# Patient Record
Sex: Female | Born: 1995 | Race: Black or African American | Hispanic: No | Smoking: Former smoker
Health system: Southern US, Community
[De-identification: ages and names within clinical notes are randomized; demographics above are authoritative.]

---
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
## 2017-03-09 ENCOUNTER — Other Ambulatory Visit: Payer: Self-pay | Admitting: Family Medicine

## 2017-03-09 DIAGNOSIS — I809 Phlebitis and thrombophlebitis of unspecified site: Secondary | ICD-10-CM

## 2017-03-10 ENCOUNTER — Ambulatory Visit
Admission: RE | Admit: 2017-03-10 | Discharge: 2017-03-10 | Disposition: A | Discharge status: Home / Self Care | Payer: Self-pay | Source: Ambulatory Visit | Attending: Family Medicine | Admitting: Family Medicine

## 2017-03-10 DIAGNOSIS — M7989 Other specified soft tissue disorders: Secondary | ICD-10-CM | POA: Insufficient documentation

## 2017-03-10 DIAGNOSIS — I809 Phlebitis and thrombophlebitis of unspecified site: Secondary | ICD-10-CM | POA: Insufficient documentation

## 2019-02-01 IMAGING — US US EXTREM LOW VENOUS*R*
1 series · 13 of 24 positions shown · non-contrast
Comparison: None.

CLINICAL DATA: Thrombophlebitis involving the right leg
approximately 4 weeks ago with persistent right lower extremity
pain. Evaluate for DVT.



[Series 1: us extrem low venous*right* · 0.08mm/px · 13 of 63 slices shown]
[im 1/63]
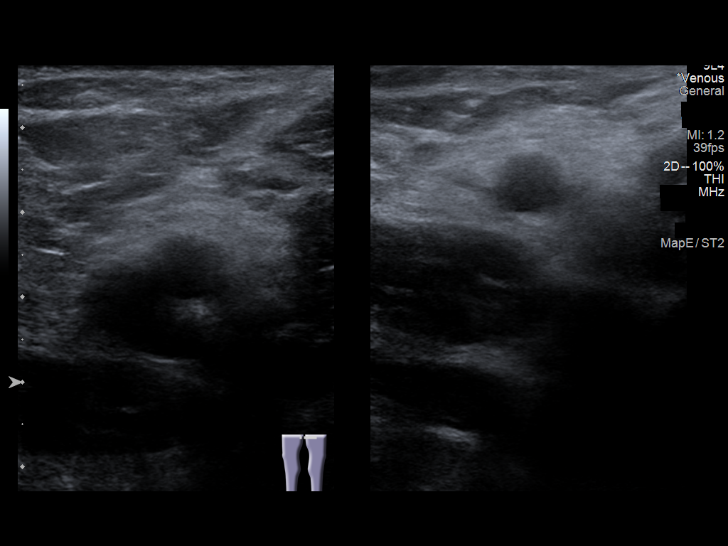
[im 6/63]
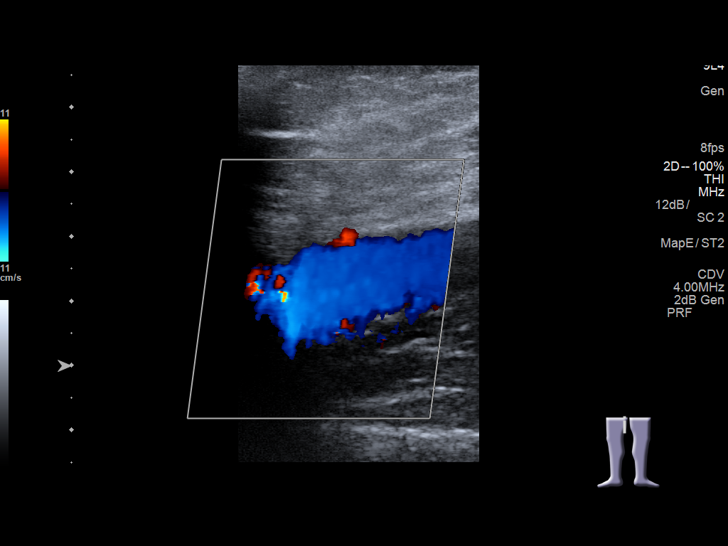
[im 11/63]
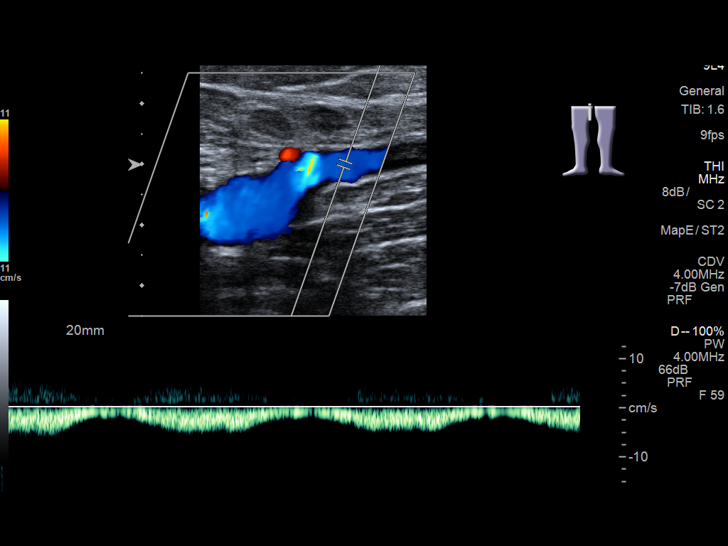
[im 17/63]
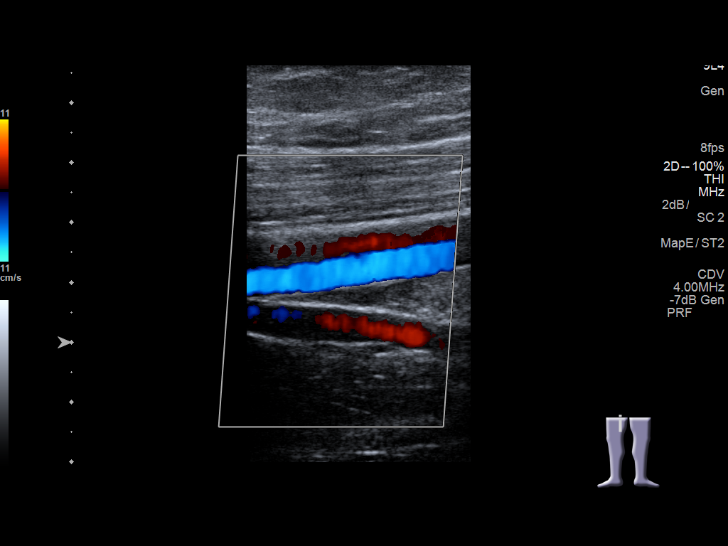
[im 22/63]
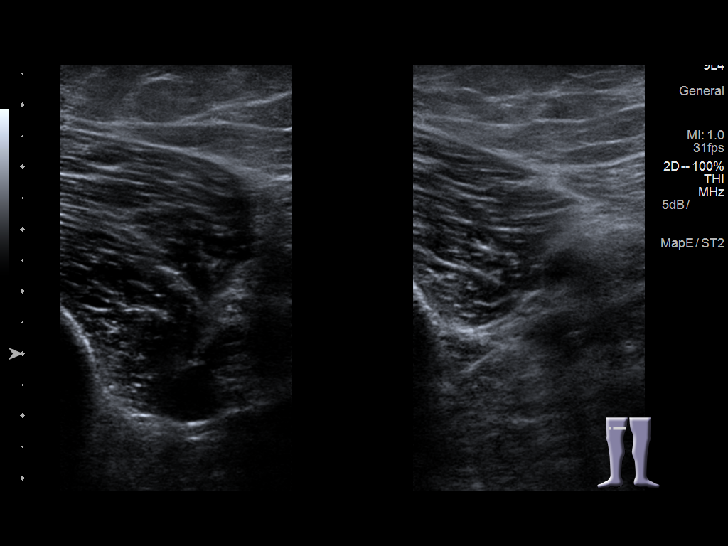
[im 27/63]
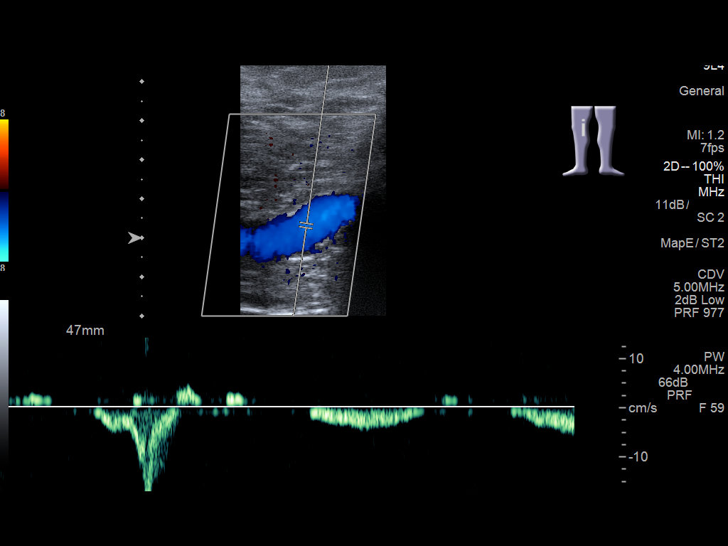
[im 33/63]
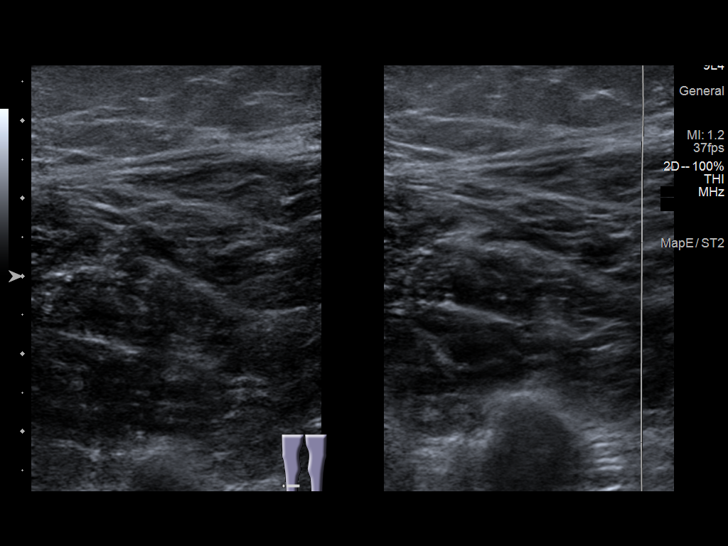
[im 36/63]
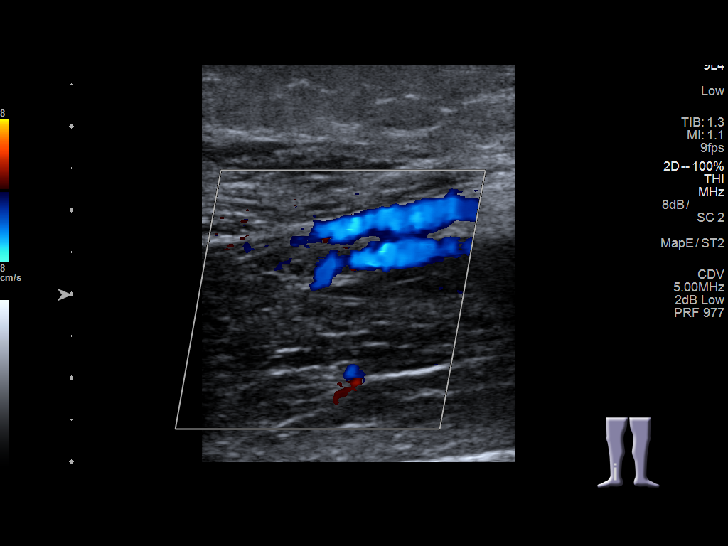
[im 41/63]
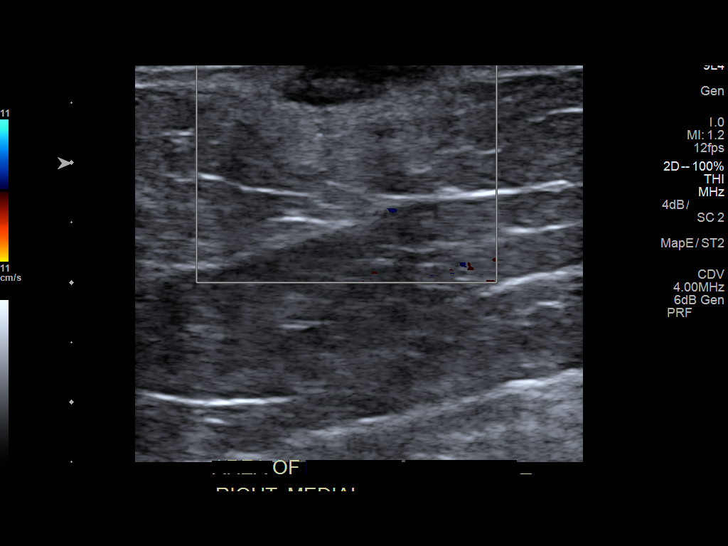
[im 46/63]
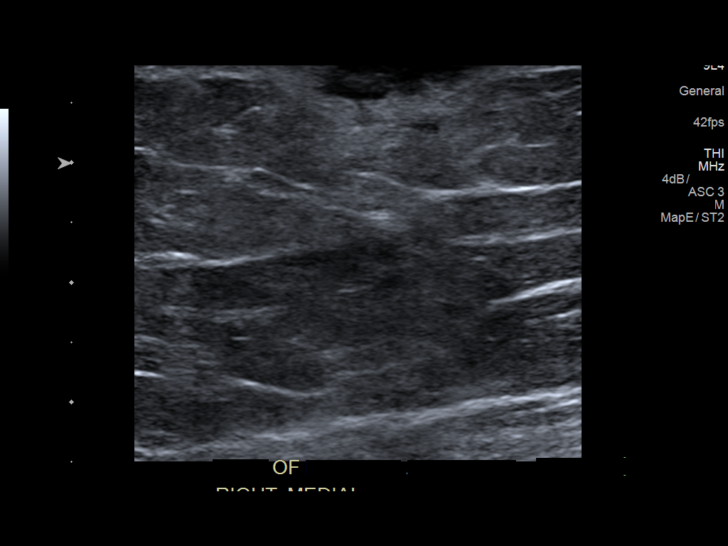
[im 52/63]
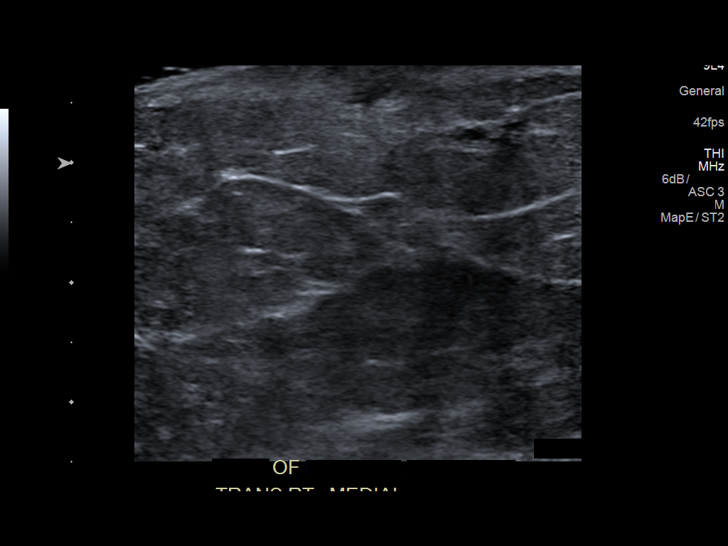
[im 57/63]
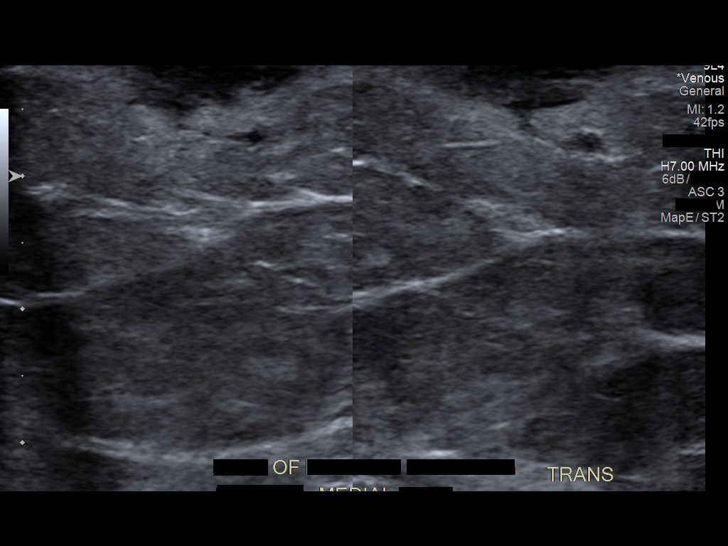
[im 63/63]
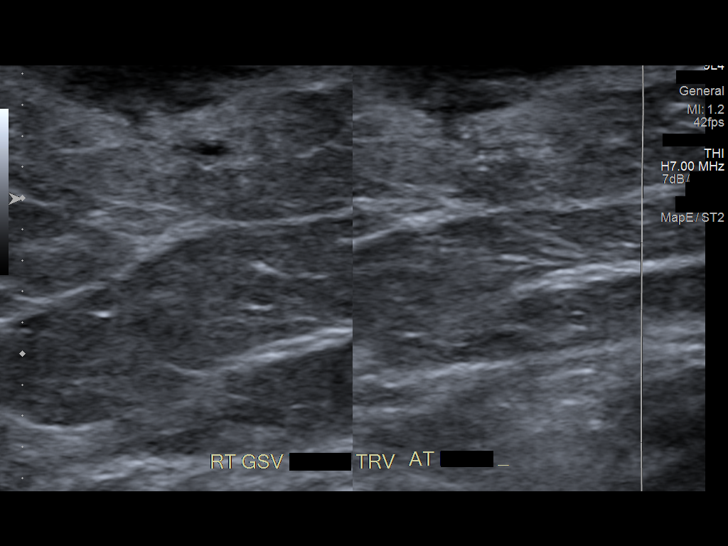

[13 of 24 positions shown; findings below may reference images not displayed]

FINDINGS: Contralateral Common Femoral Vein: Respiratory phasicity is normal
and symmetric with the symptomatic side. No evidence of thrombus.
Normal compressibility.

Common Femoral Vein: No evidence of thrombus. Normal
compressibility, respiratory phasicity and response to augmentation.

Saphenofemoral Junction: No evidence of thrombus. Normal
compressibility and flow on color Doppler imaging.

Profunda Femoral Vein: No evidence of thrombus. Normal
compressibility and flow on color Doppler imaging.

Femoral Vein: No evidence of thrombus. Normal compressibility,
respiratory phasicity and response to augmentation.

Popliteal Vein: No evidence of thrombus. Normal compressibility,
respiratory phasicity and response to augmentation.

Calf Veins: No evidence of thrombus. Normal compressibility and flow
on color Doppler imaging.

Superficial Great Saphenous Vein: No evidence of thrombus. Normal
compressibility.

Venous Reflux:  None.

Other Findings: Patient's area of concern appears to correlate with
an ill-defined approximately 1.4 x 1.2 x 0.5 cm hypoechoic apparent
fluid collection within the subdermal surface of the medial aspect
the right knee (representative images 58 and 59).
IMPRESSION: 1. No evidence of DVT within right lower extremity.
2. Patient's area of concern correlates with a punctate
(approximately 1.4 cm) apparent fluid collection within the
subdermal tissues of the right lower leg - differential
considerations are broad though include hematoma, abscess and
seroma. Clinical correlation is advised.

## 2020-09-04 ENCOUNTER — Other Ambulatory Visit: Payer: Self-pay

## 2020-09-04 ENCOUNTER — Emergency Department
Admission: EM | Admit: 2020-09-04 | Discharge: 2020-09-04 | Disposition: A | Discharge status: Home / Self Care | Payer: Medicaid Other | Attending: Emergency Medicine | Admitting: Emergency Medicine

## 2020-09-04 ENCOUNTER — Emergency Department: Payer: Medicaid Other

## 2020-09-04 DIAGNOSIS — O23591 Infection of other part of genital tract in pregnancy, first trimester: Secondary | ICD-10-CM | POA: Insufficient documentation

## 2020-09-04 DIAGNOSIS — N760 Acute vaginitis: Secondary | ICD-10-CM

## 2020-09-04 DIAGNOSIS — B9689 Other specified bacterial agents as the cause of diseases classified elsewhere: Secondary | ICD-10-CM | POA: Insufficient documentation

## 2020-09-04 DIAGNOSIS — O26891 Other specified pregnancy related conditions, first trimester: Secondary | ICD-10-CM | POA: Diagnosis present

## 2020-09-04 DIAGNOSIS — Z3A01 Less than 8 weeks gestation of pregnancy: Secondary | ICD-10-CM | POA: Insufficient documentation

## 2020-09-04 DIAGNOSIS — O219 Vomiting of pregnancy, unspecified: Secondary | ICD-10-CM

## 2020-09-04 LAB — HCG, QUANTITATIVE, PREGNANCY: hCG, Beta Chain, Quant, S: 58201 m[IU]/mL — ABNORMAL HIGH (ref <5)

## 2020-09-04 LAB — COMPREHENSIVE METABOLIC PANEL
ALT: 12 U/L (ref 0–44)
AST: 18 U/L (ref 15–41)
Albumin: 4.3 g/dL (ref 3.5–5.0)
Alkaline Phosphatase: 37 U/L — ABNORMAL LOW (ref 38–126)
Anion gap: 8 (ref 5–15)
BUN: 9 mg/dL (ref 6–20)
CO2: 23 mmol/L (ref 22–32)
Calcium: 9.1 mg/dL (ref 8.9–10.3)
Chloride: 101 mmol/L (ref 98–111)
Creatinine, Ser: 0.81 mg/dL (ref 0.44–1.00)
GFR, Estimated: >60 mL/min (ref >60)
Glucose, Bld: 88 mg/dL (ref 70–99)
Potassium: 4.2 mmol/L (ref 3.5–5.1)
Sodium: 132 mmol/L — ABNORMAL LOW (ref 135–145)
Total Bilirubin: 1.3 mg/dL — ABNORMAL HIGH (ref 0.3–1.2)
Total Protein: 7.7 g/dL (ref 6.5–8.1)

## 2020-09-04 LAB — URINALYSIS, COMPLETE (UACMP) WITH MICROSCOPIC
Bilirubin Urine: NEGATIVE
Glucose, UA: NEGATIVE mg/dL
Hgb urine dipstick: NEGATIVE
Ketones, ur: 20 mg/dL — AB
Leukocytes,Ua: NEGATIVE
Nitrite: NEGATIVE
Protein, ur: NEGATIVE mg/dL
Specific Gravity, Urine: 1.023 (ref 1.005–1.030)
pH: 5 (ref 5.0–8.0)

## 2020-09-04 LAB — CBC
HCT: 37.8 % (ref 36.0–46.0)
Hemoglobin: 13.3 g/dL (ref 12.0–15.0)
MCH: 31.5 pg (ref 26.0–34.0)
MCHC: 35.2 g/dL (ref 30.0–36.0)
MCV: 89.6 fL (ref 80.0–100.0)
Platelets: 226 10*3/uL (ref 150–400)
RBC: 4.22 MIL/uL (ref 3.87–5.11)
RDW: 12 % (ref 11.5–15.5)
WBC: 7.3 10*3/uL (ref 4.0–10.5)
nRBC: 0 % (ref 0.0–0.2)

## 2020-09-04 LAB — WET PREP, GENITAL
Sperm: NONE SEEN
Trich, Wet Prep: NONE SEEN
Yeast Wet Prep HPF POC: NONE SEEN

## 2020-09-04 LAB — ABO/RH: ABO/RH(D): O POS

## 2020-09-04 LAB — LIPASE, BLOOD: Lipase: 25 U/L (ref 11–51)

## 2020-09-04 LAB — POC URINE PREG, ED: Preg Test, Ur: POSITIVE — AB

## 2020-09-04 LAB — CHLAMYDIA/NGC RT PCR (ARMC ONLY)
Chlamydia Tr: NOT DETECTED
N gonorrhoeae: NOT DETECTED

## 2020-09-04 MED ORDER — METRONIDAZOLE 500 MG PO TABS: 500.0000 mg | ORAL_TABLET | Freq: Two times a day (BID) | ORAL | 0 refills | Status: DC

## 2020-09-04 MED ORDER — DOXYLAMINE-PYRIDOXINE 10-10 MG PO TBEC: 2.0000 | DELAYED_RELEASE_TABLET | Freq: Every day | ORAL | 0 refills | Status: DC

## 2020-09-04 NOTE — ED Triage Notes (Signed)
Pt to ER via POV. States she found out she was pregnant several days ago. Reports constant nausea, lack of appetite, and lower abdominal cramping. Denies urinary symptoms or diarrhea. Reports increased vaginal discharge that is pink tinged. Denies fevers.

## 2020-09-04 NOTE — ED Provider Notes (Signed)
[facility] Emergency Department Provider Note   ____________________________________________   Event Date/Time   First MD Initiated Contact with Patient 09/04/20 1435     (approximate)  I have reviewed the triage vital signs and the nursing notes.   HISTORY  Chief Complaint Abdominal Pain    HPI Tammy Allen is a 25 y.o. female G1, P0 Patient reports her last menstrual cycle was about the end of the first week of April.  Then about a week ago she found out she was pregnant on a home pregnancy test.  And for about a week now she has been experiencing a mild at times feels like a slight discomfort or crampiness in her lower abdomen.  She reports it feels like gas.  Associated with mild nausea and a slight food aversion.  No vomiting.  She still able to eat and drink but not as with her typical appetite  No fevers or chills.  No chest pain.  Does not take any medications or contraceptives.  No fertility treatments utilized  No history of previous surgeries  She does report she is had a slight mostly white occasionally very light pink discharge from the vagina.  No fevers or chills.  No vaginal pain.  No blood vaginal bleeding  No upper abdominal pain pain is all lower and seem sort of crampy with slight nausea.   History reviewed. No pertinent past medical history.  There are no problems to display for this patient.   History reviewed. No pertinent surgical history.  Prior to Admission medications   Medication Sig Start Date End Date Taking? Authorizing Provider  Doxylamine-Pyridoxine 10-10 MG TBEC Take 2 tablets by mouth at bedtime. 09/04/20  Yes Sharyn Creamer, MD  metroNIDAZOLE (FLAGYL) 500 MG tablet Take 1 tablet (500 mg total) by mouth 2 (two) times daily. 09/04/20  Yes Sharyn Creamer, MD  ondansetron (ZOFRAN ODT) 4 MG disintegrating tablet Take 1 tablet (4 mg total) by mouth every 8 (eight) hours as needed for nausea or vomiting. 03/31/16   Rebecka Apley, MD  sulfamethoxazole-trimethoprim (BACTRIM DS,SEPTRA DS) 800-160 MG tablet Take 1 tablet by mouth 2 (two) times daily. 03/31/16   Rebecka Apley, MD  Currently not taking any medication  Allergies Patient has no known allergies.  No family history on file.  Social History Social History   Tobacco Use  . Smoking status: Never Smoker  Substance Use Topics  . Alcohol use: Yes    Review of Systems Constitutional: No fever/chills Eyes: No visual changes.   Cardiovascular: Denies chest pain. Respiratory: Denies shortness of breath. Gastrointestinal: See HPI Genitourinary: Negative for dysuria. Musculoskeletal: Negative for back pain. Skin: Negative for rash. Neurological: Negative for headaches or weakness    ____________________________________________   PHYSICAL EXAM:  VITAL SIGNS: ED Triage Vitals  Enc Vitals Group     BP 09/04/20 1249 130/79     Pulse Rate 09/04/20 1249 62     Resp 09/04/20 1249 18     Temp 09/04/20 1249 (!) 97.5 F (36.4 C)     Temp Source 09/04/20 1249 Oral     SpO2 09/04/20 1249 100 %     Weight 09/04/20 1259 198 lb (89.8 kg)     Height 09/04/20 1259 5\' 5"  (1.651 m)     Head Circumference --      Peak Flow --      Pain Score 09/04/20 1259 4     Pain Loc --  Pain Edu? --      Excl. in GC? --     Constitutional: Alert and oriented. Well appearing and in no acute distress. Eyes: Conjunctivae are normal. Head: Atraumatic. Nose: No congestion/rhinnorhea. Mouth/Throat: Mucous membranes are moist. Neck: No stridor.  Cardiovascular: Normal rate, regular rhythm. Grossly normal heart sounds.  Good peripheral circulation. Respiratory: Normal respiratory effort.  No retractions. Lungs CTAB. Gastrointestinal: Soft and nontender. No distention.  No reproducible tenderness on palpation today.  Not obviously gravid on exam. Pelvic performed with RN Melina Schools Register demonstrates normal external exam, no tenderness on internal exam  with speculum.  Small amount of slightly white somewhat thick discharge is present in the vagina.  No bleeding. Musculoskeletal: No lower extremity tenderness nor edema. Neurologic:  Normal speech and language. No gross focal neurologic deficits are appreciated.  Skin:  Skin is warm, dry and intact. No rash noted. Psychiatric: Mood and affect are normal. Speech and behavior are normal.  ____________________________________________   LABS (all labs ordered are listed, but only abnormal results are displayed)  Labs Reviewed  WET PREP, GENITAL - Abnormal; Notable for the following components:      Result Value   Clue Cells Wet Prep HPF POC PRESENT (*)    WBC, Wet Prep HPF POC FEW (*)    All other components within normal limits  COMPREHENSIVE METABOLIC PANEL - Abnormal; Notable for the following components:   Sodium 132 (*)    Alkaline Phosphatase 37 (*)    Total Bilirubin 1.3 (*)    All other components within normal limits  URINALYSIS, COMPLETE (UACMP) WITH MICROSCOPIC - Abnormal; Notable for the following components:   Color, Urine YELLOW (*)    APPearance HAZY (*)    Ketones, ur 20 (*)    Bacteria, UA RARE (*)    All other components within normal limits  HCG, QUANTITATIVE, PREGNANCY - Abnormal; Notable for the following components:   hCG, Beta Chain, Quant, S 58,201 (*)    All other components within normal limits  POC URINE PREG, ED - Abnormal; Notable for the following components:   Preg Test, Ur Positive (*)    All other components within normal limits  CHLAMYDIA/NGC RT PCR ([facility] ONLY)  LIPASE, BLOOD  CBC  ABO/RH   ____________________________________________  EKG   ____________________________________________  RADIOLOGY  US OB Comp < 14 Wks  Result Date: 09/04/2020 CLINICAL DATA:  New pregnancy crampy lower abdominal discomfort EXAM: OBSTETRIC <14 WK [place] AND TRANSVAGINAL OB US TECHNIQUE: Both transabdominal and transvaginal ultrasound examinations were  performed for complete evaluation of the gestation as well as the maternal uterus, adnexal regions, and pelvic cul-de-sac. Transvaginal technique was performed to assess early pregnancy. COMPARISON:  None. FINDINGS: Intrauterine gestational sac: Single Yolk sac:  Visualized Embryo:  Visualized Cardiac Activity: Visualized Heart Rate: 110 bpm CRL: 4.3 mm   6 w   1 d                  [place] EDC: 04/29/2021 Subchorionic hemorrhage:  None visualized. Maternal uterus/adnexae: Ovaries are within normal limits. Right ovary measures 2.9 x 1.7 x 1.6 cm. Left ovary measures 2 x 1.2 x 2.6 cm. No significant free fluid. IMPRESSION: Single viable intrauterine pregnancy as above. No specific abnormality is seen Electronically Signed   By: Jasmine Pang M.D.   On: 09/04/2020 17:05   US OB Transvaginal  Result Date: 09/04/2020 CLINICAL DATA:  New pregnancy crampy lower abdominal discomfort EXAM: OBSTETRIC <14 WK [place] AND TRANSVAGINAL OB  US TECHNIQUE: Both transabdominal and transvaginal ultrasound examinations were performed for complete evaluation of the gestation as well as the maternal uterus, adnexal regions, and pelvic cul-de-sac. Transvaginal technique was performed to assess early pregnancy. COMPARISON:  None. FINDINGS: Intrauterine gestational sac: Single Yolk sac:  Visualized Embryo:  Visualized Cardiac Activity: Visualized Heart Rate: 110 bpm CRL: 4.3 mm   6 w   1 d                  [place] EDC: 04/29/2021 Subchorionic hemorrhage:  None visualized. Maternal uterus/adnexae: Ovaries are within normal limits. Right ovary measures 2.9 x 1.7 x 1.6 cm. Left ovary measures 2 x 1.2 x 2.6 cm. No significant free fluid. IMPRESSION: Single viable intrauterine pregnancy as above. No specific abnormality is seen Electronically Signed   By: Jasmine Pang M.D.   On: 09/04/2020 17:05    Ultrasound imaging reviewed positive intrauterine pregnancy.  Approximately 6 weeks 1  day ____________________________________________   PROCEDURES  Procedure(s) performed: None  Procedures  Critical Care performed: No  ____________________________________________   INITIAL IMPRESSION / ASSESSMENT AND PLAN / ED COURSE  Pertinent labs & imaging results that were available during my care of the patient were reviewed by me and considered in my medical decision making (see chart for details).   First pregnancy.  Very reassuring abdominal exam afebrile reassuring labs with normal white count.  Clinical exam very reassuring, she does report a slight mostly whitish discharge, check wet prep and GC.  Pelvic exam reassuring   Does not appear acutely toxic.  In no acute distress.  No cardiac pulmonary vascular or neurologic symptoms.  Will obtain ultrasound, hCG, blood type positive  ----------------------------------------- 6:38 PM on 09/04/2020 -----------------------------------------  Lab work reassuring, wet prep indicative of possible bacterial vaginosis.  Discussed with patient, will treat with oral Flagyl.  Also will prescribe doxylamine for pregnancy associated nausea.  Discussed careful return precautions and follow-up care.  Patient does plan to return to her home state of [place] on June 2 or 3.  Advised on careful return precautions.  Return precautions and treatment recommendations and follow-up discussed with the patient who is agreeable with the plan.       ____________________________________________   FINAL CLINICAL IMPRESSION(S) / ED DIAGNOSES  Final diagnoses:  BV (bacterial vaginosis)  Nausea/vomiting in pregnancy        Note:  This document was prepared using Dragon voice recognition software and may include unintentional dictation errors       Sharyn Creamer, MD 09/04/20 1839

## 2020-09-04 NOTE — ED Notes (Signed)
Patient returned from US.

## 2020-09-04 NOTE — ED Notes (Signed)
ABO/Rh added on by lab.

## 2020-09-05 ENCOUNTER — Encounter: Payer: Self-pay | Admitting: Obstetrics

## 2020-10-10 ENCOUNTER — Ambulatory Visit: Payer: Self-pay

## 2020-10-14 ENCOUNTER — Telehealth: Payer: Self-pay

## 2020-10-14 NOTE — Telephone Encounter (Signed)
Call transferred to [facility] from [place]on Passey in clerical. Client verbally upset regarding what she stated was the "run around regarding getting appt". Client counseled on presumptive Medicaid, need to apply for pregnancy Medicaid and contents of initial [facility] IP appt. (Client to [facility] ED 09/04/2020 and UPT positive with OB US completed. Per client,  LMP = 07/17/2020). Encouraged coming to [facility] to complete pre-admission paperwork prior to appt RN scheduled for 10/16/2020 with arrival time of 1245. Per client, has transportation issues and unlikely can complete pre-admission prior to appt. Client states will be leaving ~ first of August to return home, but still desired appt. Counseled to find [facility] in hometown prior to 10/16/20 appt so ROI can be signed for record transfer. Jossie Ng, RN

## 2020-10-16 ENCOUNTER — Encounter: Payer: Self-pay | Admitting: Advanced Practice Midwife

## 2020-10-16 ENCOUNTER — Other Ambulatory Visit: Payer: Self-pay

## 2020-10-16 ENCOUNTER — Ambulatory Visit: Payer: Medicaid Other | Admitting: Advanced Practice Midwife

## 2020-10-16 VITALS — BP 107/69 | HR 67 | Temp 98.2°F | Wt 196.2 lb

## 2020-10-16 DIAGNOSIS — T7421XA Adult sexual abuse, confirmed, initial encounter: Secondary | ICD-10-CM | POA: Insufficient documentation

## 2020-10-16 DIAGNOSIS — Z62810 Personal history of physical and sexual abuse in childhood: Secondary | ICD-10-CM | POA: Insufficient documentation

## 2020-10-16 DIAGNOSIS — Z3401 Encounter for supervision of normal first pregnancy, first trimester: Secondary | ICD-10-CM

## 2020-10-16 DIAGNOSIS — Z3480 Encounter for supervision of other normal pregnancy, unspecified trimester: Secondary | ICD-10-CM | POA: Insufficient documentation

## 2020-10-16 DIAGNOSIS — O99211 Obesity complicating pregnancy, first trimester: Secondary | ICD-10-CM

## 2020-10-16 DIAGNOSIS — T7421XS Adult sexual abuse, confirmed, sequela: Secondary | ICD-10-CM

## 2020-10-16 DIAGNOSIS — F32A Depression, unspecified: Secondary | ICD-10-CM

## 2020-10-16 DIAGNOSIS — O99210 Obesity complicating pregnancy, unspecified trimester: Secondary | ICD-10-CM | POA: Insufficient documentation

## 2020-10-16 DIAGNOSIS — Z720 Tobacco use: Secondary | ICD-10-CM

## 2020-10-16 DIAGNOSIS — O99341 Other mental disorders complicating pregnancy, first trimester: Secondary | ICD-10-CM

## 2020-10-16 DIAGNOSIS — O99340 Other mental disorders complicating pregnancy, unspecified trimester: Secondary | ICD-10-CM | POA: Insufficient documentation

## 2020-10-16 DIAGNOSIS — B3731 Acute candidiasis of vulva and vagina: Secondary | ICD-10-CM

## 2020-10-16 DIAGNOSIS — B373 Candidiasis of vulva and vagina: Secondary | ICD-10-CM

## 2020-10-16 HISTORY — DX: Other mental disorders complicating pregnancy, unspecified trimester: O99.340

## 2020-10-16 HISTORY — DX: Depression, unspecified: F32.A

## 2020-10-16 LAB — WET PREP FOR TRICH, YEAST, CLUE
Clue Cell Exam: POSITIVE — AB
Trichomonas Exam: NEGATIVE

## 2020-10-16 LAB — URINALYSIS
Bilirubin, UA: NEGATIVE
Glucose, UA: NEGATIVE
Nitrite, UA: NEGATIVE
Protein,UA: NEGATIVE
RBC, UA: NEGATIVE
Specific Gravity, UA: 1.025 (ref 1.005–1.030)
Urobilinogen, Ur: 1 mg/dL (ref 0.2–1.0)
pH, UA: 7 (ref 5.0–7.5)

## 2020-10-16 LAB — HEMOGLOBIN, FINGERSTICK: Hemoglobin: 12.2 g/dL (ref 11.1–15.9)

## 2020-10-16 MED ORDER — PRENATAL VITAMIN 27-0.8 MG PO TABS: 1.0000 | ORAL_TABLET | Freq: Every day | ORAL | 0 refills | Status: AC

## 2020-10-16 MED ORDER — CLOTRIMAZOLE 1 % VA CREA: 1.0000 | TOPICAL_CREAM | Freq: Every day | VAGINAL | 0 refills | Status: AC
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
## 2020-10-16 NOTE — Progress Notes (Signed)
Patient here for new OB visit at about 13 weeks. Patient states she has been in [place] in past 12 months during her time in the [employer]. ED visit 09/04/2020 with U/S. States she had a pap test in 09/2019 in [place]. Needs high BMI labs today.Burt Knack, RN

## 2020-10-16 NOTE — Progress Notes (Signed)
In house labs reviewed. No new orders. Patient treated for yeast per SO. Hazeline Junker card and dental list given. Patient plans to go to [facility] today. [facility] referral for genetic testing faxed with confirmation and patient counseled to expect call with appt.Burt Knack, RN

## 2020-10-17 ENCOUNTER — Telehealth: Payer: Self-pay

## 2020-10-17 LAB — PAP IG (IMAGE GUIDED): PAP Smear Comment: 0

## 2020-10-17 NOTE — Telephone Encounter (Signed)
Provider M. Garner, FNP asked to forward this information to E. Sciora, CNM.   Tammy Annunziato, RN 

## 2020-10-17 NOTE — Telephone Encounter (Signed)
Received a call from Britta Mccreedy, from [facility] MFM [place] scheduling department. She states they are unable to schedule a <14 week [place] since they are all booked until Mid to late August. She suggested calling [facility] in [place] or order one that's is over 14 weeks.   Will call [name] shortly for availability.   [name], RN

## 2020-10-17 NOTE — Telephone Encounter (Signed)
Contacted [name] regarding availability on their end. She states that Radiology has an opening this coming Wednesday but in order for them to schedule order mus not say words "MFM". Their number is [phone]. Also, genetic counseling is not available since it is under radiology.   Patient states she is willing to drive to [place].   So unsure to proceed with [place] in [facility] Radiology with no genetic counseling or have patient get [place] over 14 weeks in Mid to late August when they are available.   Will forward message to provider, Elveria Rising, FNP.   [name], RN

## 2020-10-19 LAB — CHLAMYDIA/GC NAA, CONFIRMATION
Chlamydia trachomatis, NAA: NEGATIVE
Neisseria gonorrhoeae, NAA: NEGATIVE

## 2020-10-20 ENCOUNTER — Other Ambulatory Visit: Payer: Self-pay | Admitting: Family Medicine

## 2020-10-20 ENCOUNTER — Other Ambulatory Visit: Payer: Self-pay | Admitting: Internal Medicine

## 2020-10-20 ENCOUNTER — Telehealth: Payer: Self-pay | Admitting: Family Medicine

## 2020-10-20 DIAGNOSIS — Z3401 Encounter for supervision of normal first pregnancy, first trimester: Secondary | ICD-10-CM

## 2020-10-20 LAB — CBC/D/PLT+RPR+RH+ABO+AB SCR
Antibody Screen: NEGATIVE
Basophils Absolute: 0 10*3/uL (ref 0.0–0.2)
Basos: 1 %
EOS (ABSOLUTE): 0.1 10*3/uL (ref 0.0–0.4)
Eos: 1 %
Hematocrit: 37.9 % (ref 34.0–46.6)
Hemoglobin: 12.6 g/dL (ref 11.1–15.9)
Hepatitis B Surface Ag: NEGATIVE
Immature Grans (Abs): 0 10*3/uL (ref 0.0–0.1)
Immature Granulocytes: 0 %
Lymphocytes Absolute: 1.2 10*3/uL (ref 0.7–3.1)
Lymphs: 16 %
MCH: 31.1 pg (ref 26.6–33.0)
MCHC: 33.2 g/dL (ref 31.5–35.7)
MCV: 94 fL (ref 79–97)
Monocytes Absolute: 0.4 10*3/uL (ref 0.1–0.9)
Monocytes: 5 %
Neutrophils Absolute: 6 10*3/uL (ref 1.4–7.0)
Neutrophils: 77 %
Platelets: 196 10*3/uL (ref 150–450)
RBC: 4.05 x10E6/uL (ref 3.77–5.28)
RDW: 12.9 % (ref 11.7–15.4)
RPR Ser Ql: NONREACTIVE
Rh Factor: POSITIVE
WBC: 7.8 10*3/uL (ref 3.4–10.8)

## 2020-10-20 LAB — COMPREHENSIVE METABOLIC PANEL
ALT: 18 IU/L (ref 0–32)
AST: 20 IU/L (ref 0–40)
Albumin/Globulin Ratio: 2 (ref 1.2–2.2)
Albumin: 4.3 g/dL (ref 3.9–5.0)
Alkaline Phosphatase: 47 IU/L (ref 44–121)
BUN/Creatinine Ratio: 16 (ref 9–23)
BUN: 10 mg/dL (ref 6–20)
Bilirubin Total: 0.2 mg/dL (ref 0.0–1.2)
CO2: 20 mmol/L (ref 20–29)
Calcium: 9 mg/dL (ref 8.7–10.2)
Chloride: 98 mmol/L (ref 96–106)
Creatinine, Ser: 0.63 mg/dL (ref 0.57–1.00)
Globulin, Total: 2.1 g/dL (ref 1.5–4.5)
Glucose: 118 mg/dL — ABNORMAL HIGH (ref 65–99)
Potassium: 3.7 mmol/L (ref 3.5–5.2)
Sodium: 132 mmol/L — ABNORMAL LOW (ref 134–144)
Total Protein: 6.4 g/dL (ref 6.0–8.5)
eGFR: 129 mL/min/{1.73_m2} (ref >59)

## 2020-10-20 LAB — HCV INTERPRETATION

## 2020-10-20 LAB — HGB FRACTIONATION CASCADE
Hgb A2: 2.9 % (ref 1.8–3.2)
Hgb A: 97.1 % (ref 96.4–98.8)
Hgb F: 0 % (ref 0.0–2.0)
Hgb S: 0 %

## 2020-10-20 LAB — QUANTIFERON-TB GOLD PLUS
QuantiFERON Mitogen Value: >10 IU/mL
QuantiFERON Nil Value: 0 IU/mL
QuantiFERON TB1 Ag Value: 0 IU/mL
QuantiFERON TB2 Ag Value: 0 IU/mL
QuantiFERON-TB Gold Plus: NEGATIVE

## 2020-10-20 LAB — TSH: TSH: 1.56 u[IU]/mL (ref 0.450–4.500)

## 2020-10-20 LAB — HGB A1C W/O EAG: Hgb A1c MFr Bld: 5.1 % (ref 4.8–5.6)

## 2020-10-20 LAB — HIV-1/HIV-2 QUALITATIVE RNA
HIV-1 RNA, Qualitative: NONREACTIVE
HIV-2 RNA, Qualitative: NONREACTIVE

## 2020-10-20 LAB — HCV AB W REFLEX TO QUANT PCR: HCV Ab: <0.1 s/co ratio (ref 0.0–0.9)

## 2020-10-20 LAB — LEAD, BLOOD (ADULT >= 16 YRS): Lead-Whole Blood: <1 ug/dL (ref 0–4)

## 2020-10-20 LAB — GLUCOSE, 1 HOUR GESTATIONAL: Gestational Diabetes Screen: 109 mg/dL (ref 65–139)

## 2020-10-20 NOTE — Telephone Encounter (Signed)
Returned patient call. She states she had a message from an RN on Friday and was calling to find out about her U/S. Original order called for first trimester screening or NIPS. Situation discussed with Dr. Alvester Morin and patient now has referral for NIPS testing and genetic counseling (had an U/S previously). Patient informed that her referral has been faxed to [facility] and will receive a phone call when her genetic counseling appointment is scheduled. Patient also counseled she will have an anatomy U/S at 18-20 weeks. Patient states understanding and is in agreement with plan. This RN to follow-up with [facility] M[facility] tomorrow and will call patient with appointment. Patient states understanding.Burt Knack, RN

## 2020-10-20 NOTE — Telephone Encounter (Signed)
Patient would like a call letting her know when her ultrasound appointment is scheduled for

## 2020-10-21 ENCOUNTER — Other Ambulatory Visit: Payer: Self-pay | Admitting: Advanced Practice Midwife

## 2020-10-21 ENCOUNTER — Telehealth: Payer: Self-pay

## 2020-10-21 DIAGNOSIS — Z3689 Encounter for other specified antenatal screening: Secondary | ICD-10-CM

## 2020-10-21 DIAGNOSIS — R52 Pain, unspecified: Secondary | ICD-10-CM

## 2020-10-21 NOTE — Telephone Encounter (Signed)
TC to patient to inform of [facility] appointment on 10/23/20 at 10:00am for genetic counseling and NIPS. Patient also informed of [facility] anatomy U/S on 12/02/20 at 10:00am. Patient counseled to arrive 15 minutes early for each appointment. Patient states understanding.Burt Knack, RN

## 2020-10-23 ENCOUNTER — Ambulatory Visit (HOSPITAL_BASED_OUTPATIENT_CLINIC_OR_DEPARTMENT_OTHER): Payer: Medicaid Other

## 2020-10-23 ENCOUNTER — Other Ambulatory Visit: Payer: Self-pay

## 2020-10-23 ENCOUNTER — Other Ambulatory Visit
Admission: RE | Admit: 2020-10-23 | Discharge: 2020-10-23 | Disposition: A | Discharge status: Home / Self Care | Payer: Medicaid Other | Source: Ambulatory Visit | Attending: Maternal & Fetal Medicine | Admitting: Maternal & Fetal Medicine

## 2020-10-23 VITALS — BP 109/73 | HR 72 | Temp 98.3°F | Ht 65.0 in | Wt 196.0 lb

## 2020-10-23 DIAGNOSIS — Z36 Encounter for antenatal screening for chromosomal anomalies: Secondary | ICD-10-CM

## 2020-10-23 DIAGNOSIS — Z3689 Encounter for other specified antenatal screening: Secondary | ICD-10-CM

## 2020-10-23 DIAGNOSIS — Z3401 Encounter for supervision of normal first pregnancy, first trimester: Secondary | ICD-10-CM

## 2020-10-23 DIAGNOSIS — R52 Pain, unspecified: Secondary | ICD-10-CM

## 2020-10-23 NOTE — Progress Notes (Addendum)
Referring provider:  [facility] Department Length of Consultation: 20 minutes   Ms. Hohman  was referred to [facility] at [facility] for genetic counseling to review prenatal screening and testing options.  This note summarizes the information we discussed with the patient and her sister, who was present with her at the visit.  We offered the following routine screening tests for this pregnancy:  Cell free fetal DNA testing from maternal blood may be used to determine whether a baby is at high risk for Down syndrome, trisomy 51, or trisomy 34.  This test utilizes a maternal blood sample and DNA sequencing technology to isolate circulating cell free fetal DNA from maternal plasma.  The fetal DNA can then be analyzed for DNA sequences that are derived from the three most common chromosomes involved in aneuploidy, chromosomes 13, 18, and 21.  If the overall amount of DNA is greater than the expected level for any of these chromosomes, aneuploidy is suspected.  The detection rate for Down syndrome and trisomy 18 is >99% and the detection rate for trisomy 13 is >91%. While we do not consider it a replacement for invasive testing and karyotype analysis, a negative result from this testing would be reassuring, though not a guarantee of a normal chromosome complement for the baby.  An abnormal result is certainly suggestive of an abnormal chromosome complement, though we would still recommend CVS or amniocentesis to confirm any findings from this testing. This testing can also assess for the sex chromosomes and can detect approximately 96% of sex chromosome aneuploidies and determine fetal gender with >99% confidence.    First trimester screening, which includes nuchal translucency ultrasound screen and first trimester maternal serum marker screening.  The nuchal translucency has approximately an 80% detection rate for Down syndrome and can be positive for other chromosome abnormalities  as well as congenital heart defects.  When combined with a maternal serum marker screening, the detection rate is up to 90% for Down syndrome and up to 97% for trisomy 18.     Maternal serum marker screening, a blood test that measures pregnancy proteins, can provide risk assessments for Down syndrome, trisomy 18, and open neural tube defects (spina bifida, anencephaly). Because it does not directly examine the fetus, it cannot positively diagnose or rule out these problems.  Targeted ultrasound uses high frequency sound waves to create an image of the developing fetus.  An ultrasound is often recommended as a routine means of evaluating the pregnancy.  It is also used to screen for fetal anatomy problems (for example, a heart defect) that might be suggestive of a chromosomal or other abnormality.   Should these screening tests indicate an increased concern, then the following additional testing options would be offered:  The chorionic villus sampling procedure is available for first trimester chromosome analysis.  This involves the withdrawal of a small amount of chorionic villi (tissue from the developing placenta).  Risk of pregnancy loss is estimated to be approximately 1 in 200 to 1 in 100 (0.5 to 1%).  There is approximately a 1% (1 in 100) chance that the CVS chromosome results will be unclear.  Chorionic villi cannot be tested for neural tube defects.     Amniocentesis involves the removal of a small amount of amniotic fluid from the sac surrounding the fetus with the use of a thin needle inserted through the maternal abdomen and uterus.  Ultrasound guidance is used throughout the procedure.  Fetal cells from  amniotic fluid are directly evaluated and > 99.5% of chromosome problems and > 98% of open neural tube defects can be detected. This procedure is generally performed after the 15th week of pregnancy.  The main risks to this procedure include complications leading to miscarriage in less than 1  in 200 cases (0.5%).  Cystic Fibrosis and Spinal Muscular Atrophy (SMA) screening were also discussed with the patient. Both conditions are recessive, which means that both parents must be carriers in order to have a child with the disease.  Cystic fibrosis (CF) is one of the most common genetic conditions in persons of Caucasian ancestry.  This condition occurs in approximately 1 in 2,500 Caucasian persons and results in thickened secretions in the lungs, digestive, and reproductive systems.  For a baby to be at risk for having CF, both of the parents must be carriers for this condition.  Approximately 1 in 6 Caucasian persons is a carrier for CF.  Current carrier testing looks for the most common mutations in the gene for CF and can detect approximately 90% of carriers in the Caucasian population.  This means that the carrier screening can greatly reduce, but cannot eliminate, the chance for an individual to have a child with CF.  If an individual is found to be a carrier for CF, then carrier testing would be available for the partner. As part of [place]'s newborn screening profile, all babies born in the state of [place] will have a two-tier screening process.  Specimens are first tested to determine the concentration of immunoreactive trypsinogen (IRT).  The top 5% of specimens with the highest IRT values then undergo DNA testing using a panel of over 40 common CF mutations. SMA is a neurodegenerative disorder that leads to atrophy of skeletal muscle and overall weakness.  This condition is also more prevalent in the Caucasian population, with 1 in 40-1 in 60 persons being a carrier and 1 in 6,000-1 in 10,000 children being affected.  There are multiple forms of the disease, with some causing death in infancy to other forms with survival into adulthood.  The genetics of SMA is complex, but carrier screening can detect up to 95% of carriers in the Caucasian population.  Similar to CF, a negative  result can greatly reduce, but cannot eliminate, the chance to have a child with SMA. Hemoglobinopathy screening was previously reported at [facility] (MCV 89).  We obtained a detailed family history and pregnancy history.  The patient reported a distant cousin with autism.  We reviewed that there may be various causes for autism, with most cases likely due to multifactorial inheritance, or a combination of genetic as well as environmental factors.  There may also be genetic syndromes, such as Fragile X syndrome, which result in autism as well as other developmental or health concerns.  We offered carrier screening for Fragile X, which the patient declined.  Given the distance of the relationship with this cousin, we would expect a low recurrence risk for autism in this pregnancy.  The remainder of the family history was reported to be unremarkable for birth defects, intellectual delays, recurrent pregnancy loss or known chromosome abnormalities.  Tammy Allen reported that his is her first pregnancy.  She has had no complications and no exposure to medications, alcohol, tobacco or recreational drugs.  She had an ultrasound at [redacted] weeks gestation.  After consideration of the options, Ms. Socarras elected to proceed with MaterniT21 testing with [facility].  She declined carrier screening for CF,  SMA and Fragile X syndrome.  Ms. Bennis was encouraged to call with questions or concerns.  We can be contacted at [phone].  Plan of care: MaterniT21 PLUS with [facility] drawn today. Pt declined carrier screening.  Prior hemoglobinopathy screening normal. Pt already scheduled for anatomy u/s on 12/02/20  Cherly Anderson, MS, CGC

## 2020-10-26 LAB — PROTEIN / CREATININE RATIO, URINE
Creatinine, Urine: 156.2 mg/dL
Protein, Ur: 9.1 mg/dL
Protein/Creat Ratio: 58 mg/g creat (ref 0–200)

## 2020-10-26 LAB — CANNABINOID CONFIRMATION, UR
CANNABINOIDS: POSITIVE — AB
Carboxy THC GC/MS Conf: 159 ng/mL

## 2020-10-26 LAB — 789231 7+OXYCODONE-BUND
Amphetamines, Urine: NEGATIVE ng/mL
BENZODIAZ UR QL: NEGATIVE ng/mL
Barbiturate screen, urine: NEGATIVE ng/mL
Cocaine (Metab.): NEGATIVE ng/mL
OPIATE SCREEN URINE: NEGATIVE ng/mL
Oxycodone/Oxymorphone, Urine: NEGATIVE ng/mL
PCP Quant, Ur: NEGATIVE ng/mL

## 2020-10-26 LAB — URINE CULTURE

## 2020-10-27 ENCOUNTER — Encounter: Payer: Self-pay | Admitting: Advanced Practice Midwife

## 2020-10-27 DIAGNOSIS — R825 Elevated urine levels of drugs, medicaments and biological substances: Secondary | ICD-10-CM | POA: Insufficient documentation

## 2020-10-27 LAB — MATERNIT21 PLUS CORE+SCA
Fetal Fraction: 10
Monosomy X (Turner Syndrome): NOT DETECTED
Result (T21): NEGATIVE
Trisomy 13 (Patau syndrome): NEGATIVE
Trisomy 18 (Edwards syndrome): NEGATIVE
Trisomy 21 (Down syndrome): NEGATIVE
XXX (Triple X Syndrome): NOT DETECTED
XXY (Klinefelter Syndrome): NOT DETECTED
XYY (Jacobs Syndrome): NOT DETECTED

## 2020-10-28 NOTE — Addendum Note (Signed)
Addended by: Heywood Bene on: 10/28/2020 09:32 AM   Modules accepted: Orders

## 2020-10-30 ENCOUNTER — Telehealth: Payer: Self-pay | Admitting: Obstetrics and Gynecology

## 2020-10-30 NOTE — Telephone Encounter (Signed)
The patient was informed of the results of her recent MaterniT21 testing which yielded NEGATIVE results.  The patient's specimen showed DNA consistent with two copies of chromosomes 21, 18 and 13.  The sensitivity for trisomy 65, trisomy 84 and trisomy 24 using this testing are reported as 99.1%, 99.9% and 91.7% respectively.  Thus, while the results of this testing are highly accurate, they are not considered diagnostic at this time.  Should more definitive information be desired, the patient may still consider amniocentesis.   As requested to know by the patient, sex chromosome analysis was included for this sample.  Results are consistent with a female fetus. This is predicted with >99% accuracy. This testing also screens for sex chromosome conditions with greater than 96% accuracy and was negative for those conditions.   A maternal serum AFP only should be considered if screening for neural tube defects is desired.  We may be reached at [phone] with any questions or concerns.  Cherly Anderson, MS, CGC

## 2020-11-12 ENCOUNTER — Other Ambulatory Visit: Payer: Self-pay

## 2020-11-12 ENCOUNTER — Ambulatory Visit: Payer: Medicaid Other | Admitting: Advanced Practice Midwife

## 2020-11-12 VITALS — BP 122/66 | HR 74 | Temp 99.3°F | Wt 198.0 lb

## 2020-11-12 DIAGNOSIS — O99340 Other mental disorders complicating pregnancy, unspecified trimester: Secondary | ICD-10-CM

## 2020-11-12 DIAGNOSIS — Z3401 Encounter for supervision of normal first pregnancy, first trimester: Secondary | ICD-10-CM

## 2020-11-12 DIAGNOSIS — Z720 Tobacco use: Secondary | ICD-10-CM

## 2020-11-12 DIAGNOSIS — R825 Elevated urine levels of drugs, medicaments and biological substances: Secondary | ICD-10-CM

## 2020-11-12 DIAGNOSIS — O99212 Obesity complicating pregnancy, second trimester: Secondary | ICD-10-CM

## 2020-11-12 DIAGNOSIS — O99342 Other mental disorders complicating pregnancy, second trimester: Secondary | ICD-10-CM

## 2020-11-12 DIAGNOSIS — F32A Depression, unspecified: Secondary | ICD-10-CM

## 2020-11-12 DIAGNOSIS — Z3402 Encounter for supervision of normal first pregnancy, second trimester: Secondary | ICD-10-CM

## 2020-11-12 DIAGNOSIS — O99210 Obesity complicating pregnancy, unspecified trimester: Secondary | ICD-10-CM
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
## 2020-11-12 NOTE — Progress Notes (Signed)
Patient here at 16w 0d for MH RV.   Patient interested in AFP genetic lab draw. Consent signed and witnessed.   [name], RN

## 2020-11-14 LAB — AFP, SERUM, OPEN SPINA BIFIDA
AFP MoM: 1.25
AFP Value: 40.4 ng/mL
Gest. Age on Collection Date: 16 weeks
Maternal Age At EDD: 23.2 yr
OSBR Risk 1 IN: 10000
Test Results:: NEGATIVE
Weight: 198 [lb_av]

## 2020-12-01 ENCOUNTER — Telehealth: Payer: Self-pay | Admitting: Family Medicine

## 2020-12-01 ENCOUNTER — Other Ambulatory Visit: Payer: Self-pay | Admitting: Advanced Practice Midwife

## 2020-12-01 DIAGNOSIS — O99212 Obesity complicating pregnancy, second trimester: Secondary | ICD-10-CM

## 2020-12-01 DIAGNOSIS — F32A Depression, unspecified: Secondary | ICD-10-CM

## 2020-12-01 DIAGNOSIS — R52 Pain, unspecified: Secondary | ICD-10-CM

## 2020-12-01 DIAGNOSIS — Z3689 Encounter for other specified antenatal screening: Secondary | ICD-10-CM

## 2020-12-01 NOTE — Telephone Encounter (Signed)
Patient wants to have her ultrasound rescheduled. Please call her back to let her know if she needs to contact [facility] directly. Thanks

## 2020-12-01 NOTE — Telephone Encounter (Signed)
Call to client and per recorded message, no voicemail is set up. Call to emergency contact and per mother, client missed flight home today and will be flying tomorrow so unable to keep [facility] [place] appt Mother aware RN will call to reschedule [place] appt tomorrow and aware appt may be towards the end of September due to number of available appts. Jossie Ng, RN

## 2020-12-02 ENCOUNTER — Ambulatory Visit: Payer: Medicaid Other

## 2020-12-02 NOTE — Telephone Encounter (Signed)
Call to client with rescheduled [facility] appt of 12/30/2020 with arrival time of 3:45 pm. Per recorded message, voicemail is not set up. Call to mother (emergency contact) and given [place] appt. Mother states will give appt information to [place]. Jossie Ng, RN

## 2020-12-10 ENCOUNTER — Ambulatory Visit: Payer: Medicaid Other | Admitting: Advanced Practice Midwife

## 2020-12-10 ENCOUNTER — Other Ambulatory Visit: Payer: Self-pay

## 2020-12-10 VITALS — BP 114/62 | HR 83 | Temp 97.9°F | Wt 205.0 lb

## 2020-12-10 DIAGNOSIS — Z3401 Encounter for supervision of normal first pregnancy, first trimester: Secondary | ICD-10-CM

## 2020-12-10 DIAGNOSIS — O99210 Obesity complicating pregnancy, unspecified trimester: Secondary | ICD-10-CM

## 2020-12-10 DIAGNOSIS — F32A Depression, unspecified: Secondary | ICD-10-CM

## 2020-12-10 DIAGNOSIS — Z720 Tobacco use: Secondary | ICD-10-CM

## 2020-12-10 DIAGNOSIS — O99340 Other mental disorders complicating pregnancy, unspecified trimester: Secondary | ICD-10-CM

## 2020-12-10 DIAGNOSIS — R825 Elevated urine levels of drugs, medicaments and biological substances: Secondary | ICD-10-CM

## 2020-12-10 LAB — URINALYSIS
Bilirubin, UA: NEGATIVE
Glucose, UA: NEGATIVE
Ketones, UA: NEGATIVE
Leukocytes,UA: NEGATIVE
Nitrite, UA: NEGATIVE
Protein,UA: NEGATIVE
RBC, UA: NEGATIVE
Specific Gravity, UA: 1.025 (ref 1.005–1.030)
Urobilinogen, Ur: 0.2 mg/dL (ref 0.2–1.0)
pH, UA: 6.5 (ref 5.0–7.5)

## 2020-12-10 NOTE — Progress Notes (Signed)
Urine dip reviewed during clinic visit.
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
## 2020-12-11 LAB — 789231 7+OXYCODONE-BUND
Amphetamines, Urine: NEGATIVE ng/mL
BENZODIAZ UR QL: NEGATIVE ng/mL
Barbiturate screen, urine: NEGATIVE ng/mL
Cannabinoid Quant, Ur: NEGATIVE ng/mL
Cocaine (Metab.): NEGATIVE ng/mL
OPIATE SCREEN URINE: NEGATIVE ng/mL
Oxycodone/Oxymorphone, Urine: NEGATIVE ng/mL
PCP Quant, Ur: NEGATIVE ng/mL

## 2020-12-30 ENCOUNTER — Ambulatory Visit: Payer: Medicaid Other | Attending: Maternal & Fetal Medicine

## 2020-12-30 ENCOUNTER — Other Ambulatory Visit: Payer: Self-pay

## 2020-12-30 DIAGNOSIS — Z3689 Encounter for other specified antenatal screening: Secondary | ICD-10-CM | POA: Diagnosis not present

## 2020-12-30 DIAGNOSIS — E669 Obesity, unspecified: Secondary | ICD-10-CM | POA: Insufficient documentation

## 2020-12-30 DIAGNOSIS — F32A Depression, unspecified: Secondary | ICD-10-CM | POA: Insufficient documentation

## 2020-12-30 DIAGNOSIS — O26892 Other specified pregnancy related conditions, second trimester: Secondary | ICD-10-CM | POA: Diagnosis not present

## 2020-12-30 DIAGNOSIS — O99342 Other mental disorders complicating pregnancy, second trimester: Secondary | ICD-10-CM | POA: Insufficient documentation

## 2020-12-30 DIAGNOSIS — R52 Pain, unspecified: Secondary | ICD-10-CM | POA: Insufficient documentation

## 2020-12-30 DIAGNOSIS — O99212 Obesity complicating pregnancy, second trimester: Secondary | ICD-10-CM | POA: Diagnosis not present

## 2020-12-30 DIAGNOSIS — Z3A22 22 weeks gestation of pregnancy: Secondary | ICD-10-CM | POA: Diagnosis not present

## 2020-12-30 DIAGNOSIS — O99340 Other mental disorders complicating pregnancy, unspecified trimester: Secondary | ICD-10-CM

## 2020-12-31 ENCOUNTER — Encounter: Payer: Self-pay | Admitting: Advanced Practice Midwife

## 2021-01-02 ENCOUNTER — Telehealth: Payer: Self-pay | Admitting: Family Medicine

## 2021-01-02 NOTE — Telephone Encounter (Signed)
Returned call to patient who states she has a sore throat and wants to know what to take for relief. Patient counseled she can take regular Tylenol, 2 tablets every 4 hours as needed, she can use Chloraseptic throat spray, and try gargling with warm salt water several times daily. Patient states she still has her safe medications in pregnancy sheet from her new OB pack. Patient asked if she could take NyQuil, patient counseled not to take NyQuil wihile pregnant. Patient states understanding and denies further questions at this time.Burt Knack, RN

## 2021-01-02 NOTE — Telephone Encounter (Signed)
Patient states she has a sore throat and wants to know what medicine she can take for it.

## 2021-01-07 ENCOUNTER — Other Ambulatory Visit: Payer: Self-pay

## 2021-01-07 ENCOUNTER — Ambulatory Visit: Payer: Medicaid Other | Admitting: Advanced Practice Midwife

## 2021-01-07 VITALS — BP 123/79 | HR 83 | Temp 97.4°F | Wt 208.8 lb

## 2021-01-07 DIAGNOSIS — O99340 Other mental disorders complicating pregnancy, unspecified trimester: Secondary | ICD-10-CM

## 2021-01-07 DIAGNOSIS — F32A Depression, unspecified: Secondary | ICD-10-CM

## 2021-01-07 DIAGNOSIS — Z3402 Encounter for supervision of normal first pregnancy, second trimester: Secondary | ICD-10-CM

## 2021-01-07 DIAGNOSIS — R825 Elevated urine levels of drugs, medicaments and biological substances: Secondary | ICD-10-CM

## 2021-01-07 DIAGNOSIS — Z720 Tobacco use: Secondary | ICD-10-CM

## 2021-01-07 DIAGNOSIS — O99212 Obesity complicating pregnancy, second trimester: Secondary | ICD-10-CM

## 2021-01-07 DIAGNOSIS — O99210 Obesity complicating pregnancy, unspecified trimester: Secondary | ICD-10-CM

## 2021-01-07 DIAGNOSIS — Z3401 Encounter for supervision of normal first pregnancy, first trimester: Secondary | ICD-10-CM

## 2021-01-07 DIAGNOSIS — O99342 Other mental disorders complicating pregnancy, second trimester: Secondary | ICD-10-CM

## 2021-01-07 LAB — HEMOGLOBIN, FINGERSTICK: Hemoglobin: 12.2 g/dL (ref 11.1–15.9)

## 2021-01-07 NOTE — Progress Notes (Signed)
Hgb today 12.2, provider aware, no new orders.Burt Knack, RN
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
## 2021-01-07 NOTE — Progress Notes (Addendum)
Patient here for MH RV at 24 weeks. S/S PTL reviewed and literature given. CMHRP and PHQ9 today. Patient informed that at next RV she will have her 28 week labs and Tdap and that it will be a longer than normal appointment. Patient states understanding.Burt Knack, RN

## 2021-02-06 ENCOUNTER — Other Ambulatory Visit: Payer: Self-pay

## 2021-02-06 ENCOUNTER — Ambulatory Visit: Payer: Medicaid Other | Admitting: Advanced Practice Midwife

## 2021-02-06 VITALS — BP 115/65 | HR 74 | Temp 97.6°F | Wt 215.0 lb

## 2021-02-06 DIAGNOSIS — Z3403 Encounter for supervision of normal first pregnancy, third trimester: Secondary | ICD-10-CM

## 2021-02-06 DIAGNOSIS — Z720 Tobacco use: Secondary | ICD-10-CM

## 2021-02-06 DIAGNOSIS — R825 Elevated urine levels of drugs, medicaments and biological substances: Secondary | ICD-10-CM

## 2021-02-06 DIAGNOSIS — Z3401 Encounter for supervision of normal first pregnancy, first trimester: Secondary | ICD-10-CM

## 2021-02-06 DIAGNOSIS — O99343 Other mental disorders complicating pregnancy, third trimester: Secondary | ICD-10-CM

## 2021-02-06 DIAGNOSIS — O99210 Obesity complicating pregnancy, unspecified trimester: Secondary | ICD-10-CM

## 2021-02-06 DIAGNOSIS — Z23 Encounter for immunization: Secondary | ICD-10-CM

## 2021-02-06 DIAGNOSIS — F32A Depression, unspecified: Secondary | ICD-10-CM

## 2021-02-06 DIAGNOSIS — O99213 Obesity complicating pregnancy, third trimester: Secondary | ICD-10-CM

## 2021-02-06 DIAGNOSIS — O99340 Other mental disorders complicating pregnancy, unspecified trimester: Secondary | ICD-10-CM

## 2021-02-06 LAB — HEMOGLOBIN, FINGERSTICK: Hemoglobin: 12.2 g/dL (ref 11.1–15.9)
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
## 2021-02-06 NOTE — Progress Notes (Signed)
Patient is here at [redacted]w[redacted]d for MH RV.   28 week labs done today and Tdap given. Flu vaccine given today. NCIR given.   Hgb reviewed during clinic. WNL (12.2).   [name], RN

## 2021-02-07 LAB — HIV-1/HIV-2 QUALITATIVE RNA
HIV-1 RNA, Qualitative: NONREACTIVE
HIV-2 RNA, Qualitative: NONREACTIVE

## 2021-02-07 LAB — RPR: RPR Ser Ql: NONREACTIVE

## 2021-02-07 LAB — GLUCOSE, 1 HOUR GESTATIONAL: Gestational Diabetes Screen: 110 mg/dL (ref 70–139)

## 2021-02-19 ENCOUNTER — Encounter: Payer: Self-pay | Admitting: Physician Assistant

## 2021-02-19 ENCOUNTER — Ambulatory Visit: Payer: Medicaid Other | Admitting: Physician Assistant

## 2021-02-19 ENCOUNTER — Other Ambulatory Visit: Payer: Self-pay

## 2021-02-19 VITALS — BP 116/69 | HR 73 | Temp 97.8°F | Wt 219.0 lb

## 2021-02-19 DIAGNOSIS — F32A Depression, unspecified: Secondary | ICD-10-CM

## 2021-02-19 DIAGNOSIS — Z3401 Encounter for supervision of normal first pregnancy, first trimester: Secondary | ICD-10-CM

## 2021-02-19 DIAGNOSIS — O99340 Other mental disorders complicating pregnancy, unspecified trimester: Secondary | ICD-10-CM

## 2021-02-19 DIAGNOSIS — O99210 Obesity complicating pregnancy, unspecified trimester: Secondary | ICD-10-CM
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
## 2021-02-23 ENCOUNTER — Telehealth: Payer: Self-pay

## 2021-02-23 NOTE — Telephone Encounter (Signed)
..   Medicaid Managed Care   Unsuccessful Outreach Note  02/23/2021 Name: Tammy Allen MRN: [id number] DOB: Aug 30, 1995  Referred by: Department, [facility] Reason for referral : High Risk Managed Medicaid (I called the patient today to offer the services of the [facility] Team. I left my name and and number on her VM.)   An unsuccessful telephone outreach was attempted today. The patient was referred to the case management team for assistance with care management and care coordination.   Follow Up Plan: The care management team will reach out to the patient again over the next 14 days.    Weston Settle Care Guide, High Risk Medicaid Managed Care Embedded Care Coordination [facility]  [facility]

## 2021-03-09 ENCOUNTER — Ambulatory Visit: Payer: Medicaid Other

## 2021-03-09 ENCOUNTER — Other Ambulatory Visit: Payer: Self-pay

## 2021-03-09 ENCOUNTER — Ambulatory Visit: Payer: Medicaid Other | Admitting: Advanced Practice Midwife

## 2021-03-09 VITALS — BP 117/69 | HR 77 | Temp 98.3°F | Wt 222.0 lb

## 2021-03-09 DIAGNOSIS — F32A Depression, unspecified: Secondary | ICD-10-CM

## 2021-03-09 DIAGNOSIS — O99210 Obesity complicating pregnancy, unspecified trimester: Secondary | ICD-10-CM

## 2021-03-09 DIAGNOSIS — O99340 Other mental disorders complicating pregnancy, unspecified trimester: Secondary | ICD-10-CM

## 2021-03-09 DIAGNOSIS — Z3401 Encounter for supervision of normal first pregnancy, first trimester: Secondary | ICD-10-CM
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
## 2021-03-09 NOTE — Progress Notes (Signed)
Patient reports having some cramping pains with diarrhea that started on Friday 03/06/2021 and continued through Saturday night 03/07/2021.  She denies any fever or vomiting and was able to drink fluids.  It has now subsided as of Sunday 03/08/2021. Hart Carwin, RN

## 2021-03-11 ENCOUNTER — Telehealth: Payer: Self-pay

## 2021-03-11 NOTE — Telephone Encounter (Signed)
Telephone call to [facility] to verify [place] appointment.  It is 03/25/21 at 9:45 am (arrival time is 9:30 am).   Telephone call to patient to make her aware of her [place] appointment at [facility] on 03/25/21 at 9:45 am and to arrive at 9:30 am.  She had already been notified by [facility]. Hart Carwin, RN

## 2021-03-11 NOTE — Telephone Encounter (Signed)
Entered in error

## 2021-03-25 ENCOUNTER — Telehealth: Payer: Self-pay

## 2021-03-25 ENCOUNTER — Encounter: Payer: Self-pay | Admitting: Physician Assistant

## 2021-03-25 NOTE — Telephone Encounter (Signed)
Call received from Maximino Sarin, [facility] OB US sonographer requesting [place] referral and prior US reports on client. Scanned referral printed and faxed with requested records. Jossie Ng, RN

## 2021-03-25 NOTE — Progress Notes (Signed)
Reviewed 03/25/21 [place] for size > dates at [redacted]w[redacted]d: EFW 70% (AGA), AFI 50%, vertex.

## 2021-03-25 NOTE — Telephone Encounter (Signed)
After multiple attempts, fax confirmation received for records faxed for [place] this am. Jossie Ng, RN

## 2021-03-26 ENCOUNTER — Other Ambulatory Visit: Payer: Self-pay

## 2021-03-26 ENCOUNTER — Ambulatory Visit: Payer: Medicaid Other | Admitting: Advanced Practice Midwife

## 2021-03-26 ENCOUNTER — Ambulatory Visit: Payer: Medicaid Other

## 2021-03-26 VITALS — BP 120/82 | HR 83 | Temp 97.3°F | Wt 227.6 lb

## 2021-03-26 DIAGNOSIS — Z3401 Encounter for supervision of normal first pregnancy, first trimester: Secondary | ICD-10-CM

## 2021-03-26 DIAGNOSIS — O99340 Other mental disorders complicating pregnancy, unspecified trimester: Secondary | ICD-10-CM

## 2021-03-26 DIAGNOSIS — O99210 Obesity complicating pregnancy, unspecified trimester: Secondary | ICD-10-CM

## 2021-03-26 DIAGNOSIS — F32A Depression, unspecified: Secondary | ICD-10-CM
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
## 2021-03-26 NOTE — Progress Notes (Signed)
Patient here for MH RV at 35 1/7. KC U/S yesterday.Burt Knack, RN

## 2021-03-27 ENCOUNTER — Other Ambulatory Visit: Payer: Self-pay | Admitting: *Deleted

## 2021-03-27 NOTE — Patient Outreach (Signed)
Care Coordination  03/27/2021  Tammy Allen [birth year] [id number]  Reached patient via phone to complete initial assessment. After discussing details of the managed Medicaid disease management program, patient declined services as she has no chronic health issues other than obesity. Patient is currently pregnant with her first child, a boy, and Is due 04/29/21.  Plan: Reviewed [place] [employer] benefits and mailed patient a summary with benefits highlighted that she would likely qualify for.  Advised her to contact this RNCM if she decides she would like to enroll in the program at a later date.   Cranford Mon RN, CCM, CDCES [place]   [facility] Care Management Coordinator - Managed [place] High Risk [phone]

## 2021-04-02 ENCOUNTER — Other Ambulatory Visit: Payer: Self-pay

## 2021-04-02 ENCOUNTER — Ambulatory Visit: Payer: Medicaid Other | Admitting: Family Medicine

## 2021-04-02 VITALS — BP 127/88 | HR 85 | Temp 97.8°F | Wt 228.4 lb

## 2021-04-02 DIAGNOSIS — Z3403 Encounter for supervision of normal first pregnancy, third trimester: Secondary | ICD-10-CM

## 2021-04-02 LAB — URINALYSIS
Bilirubin, UA: NEGATIVE
Glucose, UA: NEGATIVE
Ketones, UA: NEGATIVE
Nitrite, UA: NEGATIVE
Protein,UA: NEGATIVE
RBC, UA: NEGATIVE
Specific Gravity, UA: 1.015 (ref 1.005–1.030)
Urobilinogen, Ur: 1 mg/dL (ref 0.2–1.0)
pH, UA: 7.5 (ref 5.0–7.5)

## 2021-04-02 NOTE — Progress Notes (Signed)
Initial BP  126/86 with large cuff. 5 minute repeat with same cuff = 127/88. Urine dip today. Jossie Ng, RN

## 2021-04-06 LAB — CULTURE, BETA STREP (GROUP B ONLY): Strep Gp B Culture: NEGATIVE

## 2021-04-08 LAB — CHLAMYDIA/GC NAA, CONFIRMATION
Chlamydia trachomatis, NAA: NEGATIVE
Neisseria gonorrhoeae, NAA: NEGATIVE
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
## 2021-04-10 ENCOUNTER — Other Ambulatory Visit: Payer: Self-pay

## 2021-04-10 ENCOUNTER — Ambulatory Visit: Payer: Medicaid Other | Admitting: Physician Assistant

## 2021-04-10 ENCOUNTER — Encounter: Payer: Self-pay | Admitting: Physician Assistant

## 2021-04-10 VITALS — BP 133/96 | HR 93 | Temp 98.2°F | Wt 230.0 lb

## 2021-04-10 DIAGNOSIS — R825 Elevated urine levels of drugs, medicaments and biological substances: Secondary | ICD-10-CM

## 2021-04-10 DIAGNOSIS — Z3403 Encounter for supervision of normal first pregnancy, third trimester: Secondary | ICD-10-CM

## 2021-04-10 DIAGNOSIS — O163 Unspecified maternal hypertension, third trimester: Secondary | ICD-10-CM | POA: Insufficient documentation

## 2021-04-10 DIAGNOSIS — O99210 Obesity complicating pregnancy, unspecified trimester: Secondary | ICD-10-CM

## 2021-04-10 LAB — URINALYSIS
Bilirubin, UA: NEGATIVE
Glucose, UA: NEGATIVE
Ketones, UA: NEGATIVE
Nitrite, UA: NEGATIVE
Protein,UA: NEGATIVE
RBC, UA: NEGATIVE
Specific Gravity, UA: 1.01 (ref 1.005–1.030)
Urobilinogen, Ur: 0.2 mg/dL (ref 0.2–1.0)
pH, UA: 6 (ref 5.0–7.5)
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
## 2021-04-15 ENCOUNTER — Telehealth: Payer: Self-pay

## 2021-04-15 NOTE — Telephone Encounter (Signed)
TC to patient and LM that her FMLA paperwork has been faxed to her employer and that her copy is here in clinic and she can pick it up today or at her appt tomorrow.Marland KitchenMarland KitchenBurt Knack, RN

## 2021-04-16 ENCOUNTER — Other Ambulatory Visit: Payer: Self-pay

## 2021-04-16 ENCOUNTER — Ambulatory Visit: Payer: Medicaid Other | Admitting: Physician Assistant

## 2021-04-16 ENCOUNTER — Encounter: Payer: Self-pay | Admitting: Physician Assistant

## 2021-04-16 VITALS — BP 125/85 | HR 81 | Temp 97.3°F | Wt 231.8 lb

## 2021-04-16 DIAGNOSIS — O99210 Obesity complicating pregnancy, unspecified trimester: Secondary | ICD-10-CM

## 2021-04-16 DIAGNOSIS — O163 Unspecified maternal hypertension, third trimester: Secondary | ICD-10-CM

## 2021-04-16 DIAGNOSIS — Z3480 Encounter for supervision of other normal pregnancy, unspecified trimester: Secondary | ICD-10-CM
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
## 2021-04-18 ENCOUNTER — Encounter: Payer: Self-pay | Admitting: Obstetrics and Gynecology

## 2021-04-18 ENCOUNTER — Other Ambulatory Visit: Payer: Self-pay

## 2021-04-18 ENCOUNTER — Encounter: Payer: Self-pay | Admitting: Physician Assistant

## 2021-04-18 ENCOUNTER — Inpatient Hospital Stay
Admission: EM | Admit: 2021-04-18 | Discharge: 2021-04-21 | DRG: 786 | Disposition: A | Discharge status: Home / Self Care | Payer: Medicaid Other | Attending: Obstetrics and Gynecology | Admitting: Obstetrics and Gynecology

## 2021-04-18 DIAGNOSIS — D62 Acute posthemorrhagic anemia: Secondary | ICD-10-CM | POA: Diagnosis not present

## 2021-04-18 DIAGNOSIS — Z20822 Contact with and (suspected) exposure to covid-19: Secondary | ICD-10-CM | POA: Diagnosis present

## 2021-04-18 DIAGNOSIS — O134 Gestational [pregnancy-induced] hypertension without significant proteinuria, complicating childbirth: Secondary | ICD-10-CM | POA: Diagnosis present

## 2021-04-18 DIAGNOSIS — O9279 Other disorders of lactation: Secondary | ICD-10-CM | POA: Diagnosis not present

## 2021-04-18 DIAGNOSIS — O9081 Anemia of the puerperium: Secondary | ICD-10-CM | POA: Diagnosis not present

## 2021-04-18 DIAGNOSIS — Z87891 Personal history of nicotine dependence: Secondary | ICD-10-CM | POA: Diagnosis not present

## 2021-04-18 DIAGNOSIS — O339 Maternal care for disproportion, unspecified: Secondary | ICD-10-CM | POA: Diagnosis present

## 2021-04-18 DIAGNOSIS — Z3A38 38 weeks gestation of pregnancy: Secondary | ICD-10-CM | POA: Diagnosis not present

## 2021-04-18 DIAGNOSIS — O411230 Chorioamnionitis, third trimester, not applicable or unspecified: Secondary | ICD-10-CM | POA: Diagnosis present

## 2021-04-18 DIAGNOSIS — Z3483 Encounter for supervision of other normal pregnancy, third trimester: Secondary | ICD-10-CM | POA: Diagnosis not present

## 2021-04-18 DIAGNOSIS — O99214 Obesity complicating childbirth: Secondary | ICD-10-CM | POA: Diagnosis present

## 2021-04-18 DIAGNOSIS — R102 Pelvic and perineal pain: Secondary | ICD-10-CM | POA: Diagnosis present

## 2021-04-18 DIAGNOSIS — O133 Gestational [pregnancy-induced] hypertension without significant proteinuria, third trimester: Secondary | ICD-10-CM | POA: Diagnosis present

## 2021-04-18 HISTORY — DX: Gestational (pregnancy-induced) hypertension without significant proteinuria, third trimester: O13.3

## 2021-04-18 LAB — COMPREHENSIVE METABOLIC PANEL
ALT: 19 U/L (ref 0–44)
AST: 29 U/L (ref 15–41)
Albumin: 3.2 g/dL — ABNORMAL LOW (ref 3.5–5.0)
Alkaline Phosphatase: 153 U/L — ABNORMAL HIGH (ref 38–126)
Anion gap: 9 (ref 5–15)
BUN: 11 mg/dL (ref 6–20)
CO2: 22 mmol/L (ref 22–32)
Calcium: 9.6 mg/dL (ref 8.9–10.3)
Chloride: 102 mmol/L (ref 98–111)
Creatinine, Ser: 0.65 mg/dL (ref 0.44–1.00)
GFR, Estimated: >60 mL/min (ref >60)
Glucose, Bld: 88 mg/dL (ref 70–99)
Potassium: 4 mmol/L (ref 3.5–5.1)
Sodium: 133 mmol/L — ABNORMAL LOW (ref 135–145)
Total Bilirubin: 0.7 mg/dL (ref 0.3–1.2)
Total Protein: 7.2 g/dL (ref 6.5–8.1)

## 2021-04-18 LAB — RESP PANEL BY RT-PCR (FLU A&B, COVID) ARPGX2
Influenza A by PCR: NEGATIVE
Influenza B by PCR: NEGATIVE
SARS Coronavirus 2 by RT PCR: NEGATIVE

## 2021-04-18 LAB — CBC
HCT: 38.7 % (ref 36.0–46.0)
Hemoglobin: 13.8 g/dL (ref 12.0–15.0)
MCH: 31.1 pg (ref 26.0–34.0)
MCHC: 35.7 g/dL (ref 30.0–36.0)
MCV: 87.2 fL (ref 80.0–100.0)
Platelets: 163 10*3/uL (ref 150–400)
RBC: 4.44 MIL/uL (ref 3.87–5.11)
RDW: 12.3 % (ref 11.5–15.5)
WBC: 10.4 10*3/uL (ref 4.0–10.5)
nRBC: 0 % (ref 0.0–0.2)

## 2021-04-18 LAB — TYPE AND SCREEN
ABO/RH(D): O POS
Antibody Screen: NEGATIVE

## 2021-04-18 LAB — PROTEIN / CREATININE RATIO, URINE
Creatinine, Urine: 55 mg/dL
Protein Creatinine Ratio: 0.16 mg/mg{Cre} — ABNORMAL HIGH (ref 0.00–0.15)
Total Protein, Urine: 9 mg/dL

## 2021-04-18 MED ORDER — LIDOCAINE HCL (PF) 1 % IJ SOLN: 30.0000 mL | INTRAMUSCULAR | Status: DC | PRN

## 2021-04-18 MED ORDER — MISOPROSTOL 25 MCG QUARTER TABLET
25.0000 ug | ORAL_TABLET | ORAL | Status: DC | PRN
  Administered 2021-04-18 – 2021-04-19 (×4): 25 ug via VAGINAL
  Filled 2021-04-18 (×4): qty 1

## 2021-04-18 MED ORDER — TERBUTALINE SULFATE 1 MG/ML IJ SOLN
0.2500 mg | Freq: Once | INTRAMUSCULAR | Status: AC | PRN
  Administered 2021-04-19: 0.25 mg via SUBCUTANEOUS
  Filled 2021-04-18: qty 1

## 2021-04-18 MED ORDER — MISOPROSTOL 200 MCG PO TABS
ORAL_TABLET | ORAL | Status: AC
  Filled 2021-04-18: qty 4

## 2021-04-18 MED ORDER — OXYTOCIN BOLUS FROM INFUSION: 333.0000 mL | Freq: Once | INTRAVENOUS | Status: DC

## 2021-04-18 MED ORDER — LABETALOL HCL 5 MG/ML IV SOLN
20.0000 mg | INTRAVENOUS | Status: DC | PRN
  Administered 2021-04-18: 20 mg via INTRAVENOUS
  Filled 2021-04-18: qty 4

## 2021-04-18 MED ORDER — MISOPROSTOL 25 MCG QUARTER TABLET
25.0000 ug | ORAL_TABLET | ORAL | Status: DC | PRN
  Administered 2021-04-18 – 2021-04-19 (×4): 25 ug via BUCCAL
  Filled 2021-04-18 (×4): qty 1

## 2021-04-18 MED ORDER — ACETAMINOPHEN 325 MG PO TABS: 650.0000 mg | ORAL_TABLET | ORAL | Status: DC | PRN

## 2021-04-18 MED ORDER — OXYTOCIN-SODIUM CHLORIDE 30-0.9 UT/500ML-% IV SOLN: 2.5000 [IU]/h | INTRAVENOUS | Status: DC

## 2021-04-18 MED ORDER — LABETALOL HCL 5 MG/ML IV SOLN: 80.0000 mg | INTRAVENOUS | Status: DC | PRN

## 2021-04-18 MED ORDER — ONDANSETRON HCL 4 MG/2ML IJ SOLN: 4.0000 mg | Freq: Four times a day (QID) | INTRAMUSCULAR | Status: DC | PRN

## 2021-04-18 MED ORDER — HYDRALAZINE HCL 20 MG/ML IJ SOLN: 10.0000 mg | INTRAMUSCULAR | Status: DC | PRN

## 2021-04-18 MED ORDER — AMMONIA AROMATIC IN INHA
RESPIRATORY_TRACT | Status: AC
  Filled 2021-04-18: qty 10

## 2021-04-18 MED ORDER — OXYTOCIN-SODIUM CHLORIDE 30-0.9 UT/500ML-% IV SOLN
INTRAVENOUS | Status: AC
  Filled 2021-04-18: qty 500

## 2021-04-18 MED ORDER — FENTANYL CITRATE (PF) 100 MCG/2ML IJ SOLN
50.0000 ug | INTRAMUSCULAR | Status: DC | PRN
  Administered 2021-04-19 (×2): 100 ug via INTRAVENOUS
  Filled 2021-04-18 (×2): qty 2

## 2021-04-18 MED ORDER — OXYTOCIN-SODIUM CHLORIDE 30-0.9 UT/500ML-% IV SOLN
1.0000 m[IU]/min | INTRAVENOUS | Status: DC
  Administered 2021-04-19 (×2): 2 m[IU]/min via INTRAVENOUS

## 2021-04-18 MED ORDER — SOD CITRATE-CITRIC ACID 500-334 MG/5ML PO SOLN
30.0000 mL | ORAL | Status: DC | PRN
  Administered 2021-04-20: 30 mL via ORAL

## 2021-04-18 MED ORDER — LABETALOL HCL 5 MG/ML IV SOLN: 40.0000 mg | INTRAVENOUS | Status: DC | PRN

## 2021-04-18 MED ORDER — LACTATED RINGERS IV SOLN: INTRAVENOUS | Status: DC

## 2021-04-18 MED ORDER — OXYTOCIN 10 UNIT/ML IJ SOLN
INTRAMUSCULAR | Status: AC
  Filled 2021-04-18: qty 2

## 2021-04-18 MED ORDER — LACTATED RINGERS IV SOLN
500.0000 mL | INTRAVENOUS | Status: DC | PRN
  Administered 2021-04-18: 1000 mL via INTRAVENOUS
  Administered 2021-04-19: 500 mL via INTRAVENOUS

## 2021-04-18 NOTE — Progress Notes (Signed)
Notified R. Mcvey, CNM of results of CBC, CMP, and P/C ratio. Continue with plan to continue with admission and induction with cervical ripening every 4 hours. Will notify patient of plan.

## 2021-04-18 NOTE — H&P (Signed)
OB History & Physical   History of Present Illness:  Chief Complaint: vaginal pain and lower abdominal tightness.   HPI:  Tammy Allen is a 26 y.o. G1P0000 female at 58w3ddated [place].  She presents to L&D for vaginal pain and irreg UCs but noted to have severe range BPs on arrival in triage.   Active FM now, denies regular UCs, no LOF or VB. Denies HA, VD or RUQ pain.     Pregnancy Issues: 1. Obesity, BMI 32 2. Elevated BP at 37wks w/ UA neg for protein   Maternal Medical History:   Past Medical History:  Diagnosis Date   Depression affecting pregnancy 10/16/2020   Gestational hypertension w/o significant proteinuria in 3rd trimester 04/18/2021    Past Surgical History:  Procedure Laterality Date   WISDOM TOOTH EXTRACTION Bilateral 2014    Allergies  Allergen Reactions   Apple Itching    Itch in the gums and throat. No swelling.    Other Other (See Comments)    Throat tingling when eating Rocher Ferrero candies     Prior to Admission medications   Medication Sig Start Date End Date Taking? Authorizing Provider  Prenatal Vit-Fe Fumarate-FA (PRENATAL VITAMIN) 27-0.8 MG TABS Take 1 tablet by mouth daily. 10/16/20   ScHerbie SaxonCNM     [facility]   Social History: She  reports that she has quit smoking. Her smoking use included cigarettes. She has never been exposed to tobacco smoke. She has never used smokeless tobacco. She reports that she does not currently use alcohol. She reports that she does not currently use drugs after having used the following drugs: Marijuana.  Family History: family history includes Cancer in her paternal grandmother; Gestational diabetes in her mother.   Review of Systems: A full review of systems was performed and negative except as noted in the HPI.     Physical Exam:  Vital Signs: BP (!) 161/110    Pulse 88    Temp 98.2 F (36.8 C) (Oral)    Resp 16    LMP 07/17/2020  (Exact Date)   General: no acute distress.  HEENT: normocephalic, atraumatic Heart: regular rate & rhythm.  No murmurs/rubs/gallops Lungs: clear to auscultation bilaterally, normal respiratory effort Abdomen: soft, gravid, non-tender;  EFW: 7.5lbs Pelvic:   External: Normal external female genitalia  Cervix: Dilation: 1 / Effacement (%): Thick / Station: -3    Extremities: non-tender, symmetric, no edema bilaterally.  DTRs: 2+  Neurologic: Alert & oriented x 3.    No results found for this or any previous visit (from the past 24 hour(s)).  Pertinent Results:  Prenatal Labs: Blood type/Rh  O Pos  Antibody screen neg  Rubella  MMR x 2 doses, 2001 and 2004  Varicella  Varivax x 2 doses  RPR NR  HBsAg Neg  HIV NR  GC neg  Chlamydia neg  Genetic screening negative  1 hour GTT  110  3 hour GTT   GBS  neg   FHT: 145bpm, mod variability, + accels, no decels TOCO: Irreg occasional UC SVE:  Dilation: 1 / Effacement (%): Thick / Station: -3    Cephalic by leopolds/exam and [place] results found.  Assessment:  [name] a [age].o. G1P0000 female at 38107w3dth gestational hypertension with severe range BP.   Plan:  1. Admit to Labor & Delivery; consents reviewed and obtained - dr BeaLeafy Rotified of admission,  elevated BP.  - Labs pending, IV Labetalol 54m ordered now for severe range BP - asymptomatic. If BP again severe range, will start mag sulfate.   2. Fetal Well being  - Fetal Tracing: Cat I  - Group B Streptococcus ppx indicated: Neg - Presentation: cephalic confirmed by exam and [place]  3. Routine OB: - Prenatal labs reviewed, as above - Rh  O Pos - CBC, T&S, RPR on admit - Clear fluids, IVF  4. Induction of Labor -  Contractions: external toco in place -  Pelvis unproven, adequate for TOL -  Plan for induction with Cytotec for ripening -  Plan for continuous fetal monitoring  -  Maternal pain control as desired - Anticipate vaginal delivery  5. Post  Partum Planning: - Infant feeding: both breast and formula - Contraception: mirena IUD - Tdap and Flu vax 02/06/21  RMurray HodgkinsMcVey, CNM 04/18/21 7:30 PM

## 2021-04-19 ENCOUNTER — Inpatient Hospital Stay: Payer: Medicaid Other | Admitting: Anesthesiology

## 2021-04-19 LAB — CBC WITH DIFFERENTIAL/PLATELET
Abs Immature Granulocytes: 0.07 10*3/uL (ref 0.00–0.07)
Basophils Absolute: 0 10*3/uL (ref 0.0–0.1)
Basophils Relative: 0 %
Eosinophils Absolute: 0.1 10*3/uL (ref 0.0–0.5)
Eosinophils Relative: 1 %
HCT: 35.7 % — ABNORMAL LOW (ref 36.0–46.0)
Hemoglobin: 12.5 g/dL (ref 12.0–15.0)
Immature Granulocytes: 1 %
Lymphocytes Relative: 11 %
Lymphs Abs: 1.2 10*3/uL (ref 0.7–4.0)
MCH: 30.4 pg (ref 26.0–34.0)
MCHC: 35 g/dL (ref 30.0–36.0)
MCV: 86.9 fL (ref 80.0–100.0)
Monocytes Absolute: 0.9 10*3/uL (ref 0.1–1.0)
Monocytes Relative: 8 %
Neutro Abs: 9 10*3/uL — ABNORMAL HIGH (ref 1.7–7.7)
Neutrophils Relative %: 79 %
Platelets: 140 10*3/uL — ABNORMAL LOW (ref 150–400)
RBC: 4.11 MIL/uL (ref 3.87–5.11)
RDW: 12.3 % (ref 11.5–15.5)
WBC: 11.3 10*3/uL — ABNORMAL HIGH (ref 4.0–10.5)
nRBC: 0 % (ref 0.0–0.2)

## 2021-04-19 LAB — COMPREHENSIVE METABOLIC PANEL
ALT: 18 U/L (ref 0–44)
AST: 21 U/L (ref 15–41)
Albumin: 2.9 g/dL — ABNORMAL LOW (ref 3.5–5.0)
Alkaline Phosphatase: 127 U/L — ABNORMAL HIGH (ref 38–126)
Anion gap: 7 (ref 5–15)
BUN: 8 mg/dL (ref 6–20)
CO2: 23 mmol/L (ref 22–32)
Calcium: 8.7 mg/dL — ABNORMAL LOW (ref 8.9–10.3)
Chloride: 103 mmol/L (ref 98–111)
Creatinine, Ser: 0.72 mg/dL (ref 0.44–1.00)
GFR, Estimated: >60 mL/min (ref >60)
Glucose, Bld: 89 mg/dL (ref 70–99)
Potassium: 3.8 mmol/L (ref 3.5–5.1)
Sodium: 133 mmol/L — ABNORMAL LOW (ref 135–145)
Total Bilirubin: 0.5 mg/dL (ref 0.3–1.2)
Total Protein: 6.2 g/dL — ABNORMAL LOW (ref 6.5–8.1)

## 2021-04-19 LAB — RPR: RPR Ser Ql: NONREACTIVE

## 2021-04-19 MED ORDER — PHENYLEPHRINE 40 MCG/ML (10ML) SYRINGE FOR IV PUSH (FOR BLOOD PRESSURE SUPPORT): 80.0000 ug | PREFILLED_SYRINGE | INTRAVENOUS | Status: DC | PRN

## 2021-04-19 MED ORDER — SODIUM CHLORIDE 0.9 % IV SOLN
2.0000 g | Freq: Four times a day (QID) | INTRAVENOUS | Status: DC
  Administered 2021-04-19: 2 g via INTRAVENOUS
  Filled 2021-04-19: qty 2000

## 2021-04-19 MED ORDER — LIDOCAINE HCL (PF) 1 % IJ SOLN
INTRAMUSCULAR | Status: DC | PRN
  Administered 2021-04-19: 3 mL

## 2021-04-19 MED ORDER — SODIUM CHLORIDE 0.9 % IV SOLN
500.0000 mg | Freq: Once | INTRAVENOUS | Status: AC
  Administered 2021-04-20: 500 mg via INTRAVENOUS
  Filled 2021-04-19: qty 5

## 2021-04-19 MED ORDER — FENTANYL-BUPIVACAINE-NACL 0.5-0.125-0.9 MG/250ML-% EP SOLN
EPIDURAL | Status: AC
  Filled 2021-04-19: qty 250

## 2021-04-19 MED ORDER — TERBUTALINE SULFATE 1 MG/ML IJ SOLN
0.2500 mg | Freq: Once | INTRAMUSCULAR | Status: AC
Start: 2021-04-19 — End: 2021-04-19
  Administered 2021-04-19: 0.25 mg via SUBCUTANEOUS

## 2021-04-19 MED ORDER — CEFAZOLIN SODIUM-DEXTROSE 2-4 GM/100ML-% IV SOLN
2.0000 g | Freq: Once | INTRAVENOUS | Status: AC
  Administered 2021-04-20: 2 g via INTRAVENOUS
  Filled 2021-04-19: qty 100

## 2021-04-19 MED ORDER — BUPIVACAINE HCL (PF) 0.5 % IJ SOLN
INTRAMUSCULAR | Status: AC
  Filled 2021-04-19: qty 30

## 2021-04-19 MED ORDER — EPHEDRINE 5 MG/ML INJ: 10.0000 mg | INTRAVENOUS | Status: DC | PRN

## 2021-04-19 MED ORDER — ONDANSETRON HCL 4 MG/2ML IJ SOLN
INTRAMUSCULAR | Status: AC
  Filled 2021-04-19: qty 2

## 2021-04-19 MED ORDER — LACTATED RINGERS IV SOLN: 500.0000 mL | Freq: Once | INTRAVENOUS | Status: DC

## 2021-04-19 MED ORDER — LIDOCAINE-EPINEPHRINE (PF) 1.5 %-1:200000 IJ SOLN
INTRAMUSCULAR | Status: DC | PRN
  Administered 2021-04-19: 3 mL via EPIDURAL

## 2021-04-19 MED ORDER — FENTANYL-BUPIVACAINE-NACL 0.5-0.125-0.9 MG/250ML-% EP SOLN
EPIDURAL | Status: DC | PRN
  Administered 2021-04-19: 12 mL/h via EPIDURAL

## 2021-04-19 MED ORDER — SOD CITRATE-CITRIC ACID 500-334 MG/5ML PO SOLN
ORAL | Status: AC
  Filled 2021-04-19: qty 15

## 2021-04-19 MED ORDER — LIDOCAINE HCL (PF) 2 % IJ SOLN
INTRAMUSCULAR | Status: AC
  Filled 2021-04-19: qty 20

## 2021-04-19 MED ORDER — BUPIVACAINE HCL (PF) 0.5 % IJ SOLN
60.0000 mL | Freq: Once | INTRAMUSCULAR | Status: DC
  Filled 2021-04-19: qty 60

## 2021-04-19 MED ORDER — GENTAMICIN SULFATE 40 MG/ML IJ SOLN
5.0000 mg/kg | INTRAVENOUS | Status: DC
  Administered 2021-04-19: 530 mg via INTRAVENOUS
  Filled 2021-04-19: qty 13.25

## 2021-04-19 MED ORDER — TRANEXAMIC ACID-NACL 1000-0.7 MG/100ML-% IV SOLN
INTRAVENOUS | Status: AC
  Filled 2021-04-19: qty 100

## 2021-04-19 MED ORDER — ACETAMINOPHEN 500 MG PO TABS
1000.0000 mg | ORAL_TABLET | Freq: Four times a day (QID) | ORAL | Status: DC | PRN
  Administered 2021-04-19: 1000 mg via ORAL
  Filled 2021-04-19: qty 2

## 2021-04-19 MED ORDER — SODIUM CHLORIDE 0.9% FLUSH
20.0000 mL | Freq: Once | INTRAVENOUS | Status: DC
  Filled 2021-04-19: qty 20

## 2021-04-19 MED ORDER — CARBOPROST TROMETHAMINE 250 MCG/ML IM SOLN
INTRAMUSCULAR | Status: AC
  Filled 2021-04-19: qty 1

## 2021-04-19 MED ORDER — DIPHENHYDRAMINE HCL 50 MG/ML IJ SOLN: 12.5000 mg | INTRAMUSCULAR | Status: DC | PRN
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
## 2021-04-19 NOTE — Anesthesia Preprocedure Evaluation (Addendum)
Anesthesia Evaluation  Patient identified by MRN, date of birth, ID band Patient awake    Reviewed: Allergy & Precautions, NPO status , Patient's Chart, lab work & pertinent test results  History of Anesthesia Complications Negative for: history of anesthetic complications  Airway Mallampati: I  TM Distance: >3 FB Neck ROM: Full    Dental no notable dental hx.    Pulmonary neg pulmonary ROS, former smoker,    Pulmonary exam normal breath sounds clear to auscultation       Cardiovascular Exercise Tolerance: Good METS: 3 - Mets hypertension, negative cardio ROS Normal cardiovascular exam Rhythm:Regular Rate:Normal     Neuro/Psych PSYCHIATRIC DISORDERS Depression negative neurological ROS     GI/Hepatic negative GI ROS, Neg liver ROS,   Endo/Other  negative endocrine ROS  Renal/GU negative Renal ROS  negative genitourinary   Musculoskeletal negative musculoskeletal ROS (+)   Abdominal   Peds  Hematology negative hematology ROS (+)   Anesthesia Other Findings   Reproductive/Obstetrics (+) Pregnancy IUP at term with gest HTN                             Anesthesia Physical Anesthesia Plan  ASA: 2  Anesthesia Plan: Epidural   Post-op Pain Management:    Induction:   PONV Risk Score and Plan:   Airway Management Planned: Natural Airway  Additional Equipment:   Intra-op Plan:   Post-operative Plan:   Informed Consent: I have reviewed the patients History and Physical, chart, labs and discussed the procedure including the risks, benefits and alternatives for the proposed anesthesia with the patient or authorized representative who has indicated his/her understanding and acceptance.     Dental Advisory Given  Plan Discussed with:   Anesthesia Plan Comments: (Patient reports no bleeding problems and no anticoagulant use.   Patient consented for risks of anesthesia including  but not limited to:  - adverse reactions to medications - risk of bleeding, infection and or nerve damage from epidural that could lead to paralysis - risk of headache or failed epidural - nerve damage due to positioning - that if epidural is used for C-section that there is a chance of epidural failure requiring spinal placement or conversion to GA - Damage to heart, brain, lungs, other parts of body or loss of life  Patient voiced understanding.)        Anesthesia Quick Evaluation

## 2021-04-19 NOTE — Anesthesia Procedure Notes (Signed)
Epidural Patient location during procedure: OB Start time: 04/19/2021 3:43 PM End time: 04/19/2021 4:06 PM  Staffing Anesthesiologist: Felicita Gage, MD Performed: anesthesiologist   Preanesthetic Checklist Completed: patient identified, IV checked, site marked, risks and benefits discussed, surgical consent, monitors and equipment checked, pre-op evaluation and timeout performed  Epidural Patient position: sitting Prep: ChloraPrep Patient monitoring: heart rate, continuous pulse ox and blood pressure Approach: midline Location: L3-L4 Injection technique: LOR saline  Needle:  Needle type: Tuohy  Needle gauge: 17 G Needle length: 9 cm and 9 Needle insertion depth: 9 cm Catheter type: closed end flexible Catheter size: 19 Gauge Catheter at skin depth: 15 cm Test dose: negative and 1.5% lidocaine with Epi 1:200 K  Assessment Sensory level: T10 Events: blood not aspirated, injection not painful, no injection resistance, no paresthesia and negative IV test  Additional Notes 2 attempts Pt. Evaluated and documentation done after procedure finished. Patient identified. Risks/Benefits/Options discussed with patient including but not limited to bleeding, infection, nerve damage, paralysis, failed block, incomplete pain control, headache, blood pressure changes, nausea, vomiting, reactions to medication both or allergic, itching and postpartum back pain. Confirmed with bedside nurse the patient's most recent platelet count. Confirmed with patient that they are not currently taking any anticoagulation, have any bleeding history or any family history of bleeding disorders. Patient expressed understanding and wished to proceed. All questions were answered. Sterile technique was used throughout the entire procedure. Please see nursing notes for vital signs. Test dose was given through epidural catheter and negative prior to continuing to dose epidural or start infusion. Warning signs of high  block given to the patient including shortness of breath, tingling/numbness in hands, complete motor block, or any concerning symptoms with instructions to call for help. Patient was given instructions on fall risk and not to get out of bed. All questions and concerns addressed with instructions to call with any issues or inadequate analgesia.    First attempt- difficult to inject via catheter.  Able to give about 1 mL of 1.5% lido with epi test dose before cathter got to tight to inject.  Elected to pull and do second catheter.  Of note- Loading dose atypically got pt confortable (not feeling ctxs at all) after only 3mL of 1 % Lidocaine PF.  Elected to strop loading dose and start infusion at that time.  BP and fetal heart tones stable.  Patient tolerated the insertion well without immediate complications.Reason for block:at surgeon's request and procedure for pain
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
## 2021-04-19 NOTE — Progress Notes (Signed)
Patient ID: Tammy Allen, female   DOB: Jan 17, 1996, 26 y.o.   MRN: [id number] Fetal monitoring reviewed . + variability . Fetal tachycardia related to terbutaline administered . Continue to monitor. Keep pitocin off for now .
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
## 2021-04-20 ENCOUNTER — Encounter: Payer: Self-pay | Admitting: Obstetrics and Gynecology

## 2021-04-20 ENCOUNTER — Encounter
Admission: EM | Disposition: A | Discharge status: Home / Self Care | Payer: Self-pay | Source: Home / Self Care | Attending: Obstetrics and Gynecology

## 2021-04-20 DIAGNOSIS — Z3483 Encounter for supervision of other normal pregnancy, third trimester: Secondary | ICD-10-CM | POA: Diagnosis not present

## 2021-04-20 LAB — CBC
HCT: 32.9 % — ABNORMAL LOW (ref 36.0–46.0)
Hemoglobin: 11.4 g/dL — ABNORMAL LOW (ref 12.0–15.0)
MCH: 30.6 pg (ref 26.0–34.0)
MCHC: 34.7 g/dL (ref 30.0–36.0)
MCV: 88.2 fL (ref 80.0–100.0)
Platelets: 139 10*3/uL — ABNORMAL LOW (ref 150–400)
RBC: 3.73 MIL/uL — ABNORMAL LOW (ref 3.87–5.11)
RDW: 12.6 % (ref 11.5–15.5)
WBC: 23.3 10*3/uL — ABNORMAL HIGH (ref 4.0–10.5)
nRBC: 0 % (ref 0.0–0.2)

## 2021-04-20 SURGERY — Surgical Case
Anesthesia: Epidural

## 2021-04-20 MED ORDER — DIPHENHYDRAMINE HCL 25 MG PO CAPS: 25.0000 mg | ORAL_CAPSULE | Freq: Four times a day (QID) | ORAL | Status: DC | PRN

## 2021-04-20 MED ORDER — OXYTOCIN-SODIUM CHLORIDE 30-0.9 UT/500ML-% IV SOLN
2.5000 [IU]/h | INTRAVENOUS | Status: AC
  Administered 2021-04-20 (×2): 2.5 [IU]/h via INTRAVENOUS
  Filled 2021-04-20 (×2): qty 500

## 2021-04-20 MED ORDER — DIPHENOXYLATE-ATROPINE 2.5-0.025 MG PO TABS: 1.0000 | ORAL_TABLET | Freq: Once | ORAL | Status: DC

## 2021-04-20 MED ORDER — ACETAMINOPHEN 500 MG PO TABS
1000.0000 mg | ORAL_TABLET | Freq: Four times a day (QID) | ORAL | Status: DC
  Administered 2021-04-20 – 2021-04-21 (×6): 1000 mg via ORAL
  Filled 2021-04-20 (×7): qty 2

## 2021-04-20 MED ORDER — MORPHINE SULFATE (PF) 0.5 MG/ML IJ SOLN
INTRAMUSCULAR | Status: AC
  Filled 2021-04-20: qty 10

## 2021-04-20 MED ORDER — ONDANSETRON HCL 4 MG/2ML IJ SOLN
INTRAMUSCULAR | Status: DC | PRN
  Administered 2021-04-20: 4 mg via INTRAVENOUS

## 2021-04-20 MED ORDER — MORPHINE SULFATE (PF) 2 MG/ML IV SOLN: 1.0000 mg | INTRAVENOUS | Status: DC | PRN

## 2021-04-20 MED ORDER — 0.9 % SODIUM CHLORIDE (POUR BTL) OPTIME
TOPICAL | Status: DC | PRN
Start: 2021-04-20 — End: 2021-04-20
  Administered 2021-04-20: 1000 mL

## 2021-04-20 MED ORDER — PRENATAL MULTIVITAMIN CH
1.0000 | ORAL_TABLET | Freq: Every day | ORAL | Status: DC
  Administered 2021-04-20 – 2021-04-21 (×2): 1 via ORAL
  Filled 2021-04-20 (×2): qty 1

## 2021-04-20 MED ORDER — NALOXONE HCL 4 MG/10ML IJ SOLN
1.0000 ug/kg/h | INTRAVENOUS | Status: DC | PRN
  Filled 2021-04-20: qty 5

## 2021-04-20 MED ORDER — CLINDAMYCIN PHOSPHATE 900 MG/50ML IV SOLN
900.0000 mg | Freq: Three times a day (TID) | INTRAVENOUS | Status: DC
  Administered 2021-04-20 (×3): 900 mg via INTRAVENOUS
  Filled 2021-04-20 (×5): qty 50

## 2021-04-20 MED ORDER — COCONUT OIL OIL: 1.0000 "application " | TOPICAL_OIL | Status: DC | PRN

## 2021-04-20 MED ORDER — LIDOCAINE HCL (PF) 2 % IJ SOLN
INTRAMUSCULAR | Status: DC | PRN
  Administered 2021-04-20 (×4): 5 mg via INTRADERMAL

## 2021-04-20 MED ORDER — PHENYLEPHRINE HCL-NACL 20-0.9 MG/250ML-% IV SOLN
INTRAVENOUS | Status: DC | PRN
  Administered 2021-04-20: 30 ug/min via INTRAVENOUS

## 2021-04-20 MED ORDER — NALOXONE HCL 0.4 MG/ML IJ SOLN: 0.4000 mg | INTRAMUSCULAR | Status: DC | PRN

## 2021-04-20 MED ORDER — DIPHENHYDRAMINE HCL 25 MG PO CAPS: 25.0000 mg | ORAL_CAPSULE | ORAL | Status: DC | PRN

## 2021-04-20 MED ORDER — OXYTOCIN-SODIUM CHLORIDE 30-0.9 UT/500ML-% IV SOLN
INTRAVENOUS | Status: DC | PRN
  Administered 2021-04-20: 250 mL/h via INTRAVENOUS

## 2021-04-20 MED ORDER — GABAPENTIN 300 MG PO CAPS
300.0000 mg | ORAL_CAPSULE | Freq: Every day | ORAL | Status: DC
  Administered 2021-04-20: 300 mg via ORAL
  Filled 2021-04-20 (×2): qty 1

## 2021-04-20 MED ORDER — GENTAMICIN SULFATE 40 MG/ML IJ SOLN
5.0000 mg/kg | INTRAVENOUS | Status: DC
  Administered 2021-04-20: 530 mg via INTRAVENOUS
  Filled 2021-04-20: qty 13.25

## 2021-04-20 MED ORDER — MENTHOL 3 MG MT LOZG
1.0000 | LOZENGE | OROMUCOSAL | Status: DC | PRN
  Filled 2021-04-20: qty 9

## 2021-04-20 MED ORDER — ZOLPIDEM TARTRATE 5 MG PO TABS: 5.0000 mg | ORAL_TABLET | Freq: Every evening | ORAL | Status: DC | PRN

## 2021-04-20 MED ORDER — SIMETHICONE 80 MG PO CHEW
80.0000 mg | CHEWABLE_TABLET | Freq: Three times a day (TID) | ORAL | Status: DC
  Administered 2021-04-20 – 2021-04-21 (×4): 80 mg via ORAL
  Filled 2021-04-20 (×5): qty 1

## 2021-04-20 MED ORDER — SIMETHICONE 80 MG PO CHEW: 80.0000 mg | CHEWABLE_TABLET | ORAL | Status: DC | PRN

## 2021-04-20 MED ORDER — PHENYLEPHRINE HCL (PRESSORS) 10 MG/ML IV SOLN
INTRAVENOUS | Status: DC | PRN
  Administered 2021-04-20: 160 ug via INTRAVENOUS

## 2021-04-20 MED ORDER — DEXAMETHASONE SODIUM PHOSPHATE 10 MG/ML IJ SOLN
INTRAMUSCULAR | Status: AC
  Filled 2021-04-20: qty 1

## 2021-04-20 MED ORDER — SODIUM CHLORIDE 0.9% FLUSH
INTRAVENOUS | Status: DC | PRN
  Administered 2021-04-20: 20 mL

## 2021-04-20 MED ORDER — DIBUCAINE (PERIANAL) 1 % EX OINT: 1.0000 "application " | TOPICAL_OINTMENT | CUTANEOUS | Status: DC | PRN

## 2021-04-20 MED ORDER — ONDANSETRON HCL 4 MG/2ML IJ SOLN: 4.0000 mg | Freq: Three times a day (TID) | INTRAMUSCULAR | Status: DC | PRN

## 2021-04-20 MED ORDER — SCOPOLAMINE 1 MG/3DAYS TD PT72
1.0000 | MEDICATED_PATCH | Freq: Once | TRANSDERMAL | Status: DC
  Administered 2021-04-20: 1.5 mg via TRANSDERMAL
  Filled 2021-04-20: qty 1

## 2021-04-20 MED ORDER — DEXAMETHASONE SODIUM PHOSPHATE 10 MG/ML IJ SOLN
INTRAMUSCULAR | Status: DC | PRN
  Administered 2021-04-20: 10 mg via INTRAVENOUS

## 2021-04-20 MED ORDER — SENNOSIDES-DOCUSATE SODIUM 8.6-50 MG PO TABS
2.0000 | ORAL_TABLET | Freq: Every day | ORAL | Status: DC
  Administered 2021-04-21: 2 via ORAL
  Filled 2021-04-20: qty 2

## 2021-04-20 MED ORDER — TETANUS-DIPHTH-ACELL PERTUSSIS 5-2.5-18.5 LF-MCG/0.5 IM SUSY
0.5000 mL | PREFILLED_SYRINGE | Freq: Once | INTRAMUSCULAR | Status: DC
  Filled 2021-04-20: qty 0.5

## 2021-04-20 MED ORDER — WITCH HAZEL-GLYCERIN EX PADS: 1.0000 "application " | MEDICATED_PAD | CUTANEOUS | Status: DC | PRN

## 2021-04-20 MED ORDER — IBUPROFEN 600 MG PO TABS
600.0000 mg | ORAL_TABLET | Freq: Four times a day (QID) | ORAL | Status: DC
  Administered 2021-04-21 (×3): 600 mg via ORAL
  Filled 2021-04-20 (×3): qty 1

## 2021-04-20 MED ORDER — SODIUM CHLORIDE 0.9% FLUSH: 3.0000 mL | INTRAVENOUS | Status: DC | PRN

## 2021-04-20 MED ORDER — KETOROLAC TROMETHAMINE 30 MG/ML IJ SOLN
30.0000 mg | Freq: Four times a day (QID) | INTRAMUSCULAR | Status: AC
  Administered 2021-04-20 (×4): 30 mg via INTRAVENOUS
  Filled 2021-04-20 (×3): qty 1

## 2021-04-20 MED ORDER — OXYCODONE HCL 5 MG PO TABS: 5.0000 mg | ORAL_TABLET | ORAL | Status: DC | PRN

## 2021-04-20 MED ORDER — KETOROLAC TROMETHAMINE 30 MG/ML IJ SOLN
30.0000 mg | Freq: Once | INTRAMUSCULAR | Status: DC
  Filled 2021-04-20: qty 1

## 2021-04-20 MED ORDER — ENOXAPARIN SODIUM 60 MG/0.6ML IJ SOSY
50.0000 mg | PREFILLED_SYRINGE | INTRAMUSCULAR | Status: DC
  Administered 2021-04-21: 50 mg via SUBCUTANEOUS
  Filled 2021-04-20: qty 0.5

## 2021-04-20 MED ORDER — BUPIVACAINE HCL (PF) 0.5 % IJ SOLN
INTRAMUSCULAR | Status: DC | PRN
  Administered 2021-04-20: 60 mL

## 2021-04-20 MED ORDER — CARBOPROST TROMETHAMINE 250 MCG/ML IM SOLN
INTRAMUSCULAR | Status: DC | PRN
  Administered 2021-04-20: 250 ug via INTRAMUSCULAR

## 2021-04-20 MED ORDER — DIPHENHYDRAMINE HCL 50 MG/ML IJ SOLN: 12.5000 mg | INTRAMUSCULAR | Status: DC | PRN

## 2021-04-20 SURGICAL SUPPLY — 30 items
BARRIER ADHS 3X4 INTERCEED (GAUZE/BANDAGES/DRESSINGS) ×2 IMPLANT
CHLORAPREP W/TINT 26 (MISCELLANEOUS) ×2 IMPLANT
DRSG TELFA 3X8 NADH (GAUZE/BANDAGES/DRESSINGS) ×2 IMPLANT
ELECT CAUTERY BLADE 6.4 (BLADE) ×2 IMPLANT
ELECT REM PT RETURN 9FT ADLT (ELECTROSURGICAL) ×2
ELECTRODE REM PT RTRN 9FT ADLT (ELECTROSURGICAL) ×1 IMPLANT
GAUZE SPONGE 4X4 12PLY STRL (GAUZE/BANDAGES/DRESSINGS) ×2 IMPLANT
GAUZE SPONGE 4X4 12PLY STRL LF (GAUZE/BANDAGES/DRESSINGS) ×1 IMPLANT
GLOVE SURG SYN 8.0 (GLOVE) ×2 IMPLANT
GLOVE SURG SYN 8.0 PF PI (GLOVE) ×1 IMPLANT
GOWN STRL REUS W/ TWL LRG LVL3 (GOWN DISPOSABLE) ×2 IMPLANT
GOWN STRL REUS W/ TWL XL LVL3 (GOWN DISPOSABLE) ×1 IMPLANT
GOWN STRL REUS W/TWL LRG LVL3 (GOWN DISPOSABLE) ×2
GOWN STRL REUS W/TWL XL LVL3 (GOWN DISPOSABLE) ×1
MANIFOLD NEPTUNE II (INSTRUMENTS) ×2 IMPLANT
MAT PREVALON FULL STRYKER (MISCELLANEOUS) ×2 IMPLANT
NEEDLE HYPO 22GX1.5 SAFETY (NEEDLE) ×2 IMPLANT
NS IRRIG 1000ML POUR BTL (IV SOLUTION) ×2 IMPLANT
PACK C SECTION AR (MISCELLANEOUS) ×2 IMPLANT
PAD DRESSING TELFA 3X8 NADH (GAUZE/BANDAGES/DRESSINGS) ×1 IMPLANT
PAD OB MATERNITY 4.3X12.25 (PERSONAL CARE ITEMS) ×2 IMPLANT
PAD PREP 24X41 OB/GYN DISP (PERSONAL CARE ITEMS) ×2 IMPLANT
SCRUB EXIDINE 4% CHG 4OZ (MISCELLANEOUS) ×2 IMPLANT
STRAP SAFETY 5IN WIDE (MISCELLANEOUS) ×2 IMPLANT
SUT CHROMIC 1 CTX 36 (SUTURE) ×6 IMPLANT
SUT PLAIN GUT 0 (SUTURE) ×4 IMPLANT
SUT VIC AB 0 CT1 36 (SUTURE) ×4 IMPLANT
SYR 30ML LL (SYRINGE) ×4 IMPLANT
TAPE PAPER 2X10 WHT MICROPORE (GAUZE/BANDAGES/DRESSINGS) ×1 IMPLANT
WATER STERILE IRR 500ML POUR (IV SOLUTION) ×2 IMPLANT

## 2021-04-20 NOTE — Lactation Note (Signed)
This note was copied from a baby's chart. Lactation Consultation Note  Patient Name: Tammy Allen [id number] Date: 04/20/2021   Age:24 hours  Lactation check in. Baby had received second blood sugar above 40- this last time a pre-feed. Mom said he latched and was at breast for 5 minutes, and she has been attempting the last 10 minutes to give him formula via bottle for supplement; baby is asleep in mom's arms not sucking on nipple, formula falling out of mouth, suggested mom take a break. We discussed feeding plan for overnight. Aim for feedings minimum every 3-4 hours, offering breast first- utilizing tips for keeping him awake at the breast, and breast massage/compression while feeding to aid in transfer, offering formula post feeding if it was a poor feeding. We also discussed pumping- at this time mom would like to keep regular feeding attempts at the breast.   Maternal Data    Feeding Nipple Type: Slow - flow  LATCH Score                    Lactation Tools Discussed/Used    Interventions    Discharge    Consult Status      Danford Bad 04/20/2021, 5:16 PM

## 2021-04-20 NOTE — Plan of Care (Signed)
Transferred to Room 351 s/p C/S. Alert and oriented with pleasant affect. Color good, skin w&d. Oriented to Room, Safety and Security, Fall Prevention and POC. Pt. V/O. Pt. Has Foley Catheter that is draining clear, amber Urine. Denies c/o.

## 2021-04-20 NOTE — Op Note (Signed)
Tammy Allen, HORTIN MEDICAL RECORD NO: [id number] ACCOUNT NO: [id number] DATE OF BIRTH: 1995/11/08 FACILITY: [facility] LOCATION: [facility] PHYSICIAN: Boykin Nearing, MD  Operative Report   DATE OF PROCEDURE: 04/20/2021   PREOPERATIVE DIAGNOSIS:   1.  24+5 weeks estimated gestational age. 2.  Nonreassuring fetal monitoring. 3.  Chorioamnionitis.  POSTOPERATIVE DIAGNOSES:   1.  24+5 weeks estimated gestational age. 2.  Nonreassuring fetal monitoring. 3.  Chorioamnionitis. 4.  Cephalopelvic disproportion. 5. Vigorous female, delivered.  PROCEDURE:  Primary low transverse cesarean section.    ANESTHESIA:  Surgical dosing of continuous lumbar epidural.  SURGEON:  Boykin Nearing, MD  FIRST ASSISTANT:  Roby Lofts, certified nurse midwife.  INDICATIONS:  A 26 year old gravida 1, para 0, patient was admitted for regular uterine contractions.  The patient was admitted, underwent labor.  Fetus demonstrated nonreassuring fetal monitoring and first stage of labor.  The patient had developed a  fever and was diagnosed with chorioamnionitis.  DESCRIPTION OF PROCEDURE:  After surgical dosing of continuous lumbar epidural, patient was placed in dorsal supine position with a hip roll on the right side.  The patient's abdomen was prepped and draped in normal sterile fashion.  Timeout was  performed.  A Pfannenstiel incision was made 2 fingerbreadths above the symphysis pubis.  Sharp dissection was used to identify the fascia.  Fascia was opened in the midline and opened in transverse fashion.  The superior aspect of the fascia was grasped  with Kocher clamps and the recti muscles were dissected free.  The inferior aspect of the fascia was grasped with Kocher clamps and the pyramidalis muscle was dissected free.  Entry into the peritoneal cavity was accomplished sharply.  Vesicouterine  peritoneal fold was identified and bladder flap was created and the bladder was reflected  inferiorly.  Low transverse uterine incision was made.  Upon entry into the endometrial cavity, clear fluid resulted.  The incision was extended with blunt  transverse traction.  Extremely wedged molded fetal head was then elevated out of the pelvis and with fundal pressure, the head and shoulders were delivered.  Of note, a loose nuchal cord was reduced.  A vigorous female who was dried on the mother's  abdomen for 60 seconds and cord was then doubly clamped and passed to nursery staff who assigned Apgars scores of 8 and 8.  Fetal weight 3912 grams, time of birth 0027 on 04/20/2021.  The placenta was manually delivered and the uterus was exteriorized.   Endometrial cavity was wiped clean with laparotomy tape and the uterine incision was closed with 1 chromic suture in a running locking fashion.  One additional figure-of-eight suture required for hemostasis.  Fallopian tubes and ovaries appeared normal.   Posterior cul-de-sac was irrigated and suctioned.  Uterus was placed back into the abdominal cavity and the paracolic gutters were wiped clean with laparotomy tape.  Uterine incision again appeared hemostatic and Interceed was placed over the uterine  incision in a T-shape fashion.  The fascia was then closed with 0 Vicryl suture in a running nonlocking fashion.  One additional figure-of-eight centrally was used.  The fascial edges were injected with a solution of 60 mL of 0.5% Marcaine plus 20 mL  normal saline.  Approximately 50 mL of the solution was injected beneath the fascia.  Subcutaneous tissues were irrigated and bovied for hemostasis and the skin was reapproximated with Insorb absorbable staples and a sterile dressing was applied after  injecting additional 20 mL of Marcaine solution.  The patient tolerated  the procedure well.  Quantitative blood loss 1025 mL.  URINE OUTPUT:  100 mL.  INTRAOPERATIVE FLUIDS:  500 mL.    The patient tolerated the procedure well and was taken to recovery room in  good condition.     [email or web address].[email or web address] D: 04/20/2021 1:09:42 am T: 04/20/2021 1:50:00 am  JOB: [id number]

## 2021-04-20 NOTE — Lactation Note (Addendum)
This note was copied from a baby's chart. Lactation Consultation Note  Patient Name: Tammy Allen [id number]'U Date: 04/20/2021 Reason for consult: Initial assessment;Primapara;Early term 37-38.6wks;Other (Comment) (low sugars; c-section) Age:24 hours  Initial lactation visit for first time mom who delivered via c-section 11 hours ago to baby at [redacted]w[redacted]d. Baby has had up and down blood sugar levels since delivery, now being supplemented with 24cal formula to help stabilize.  Mom stated that she felt feedings were going well, baby was latching on, seeming satisfied post feedings and sleeping, output has already exceeded expectations. We reviewed feeding protocols and tools used to help with ongoing stimulation and protect the building of the supply through pumping. This may be a consideration if baby continues to struggle with low blood sugars.   Maternal Data Does the patient have breastfeeding experience prior to this delivery?: No  Feeding Mother's Current Feeding Choice: Breast Milk Nipple Type: Slow - flow  LATCH Score                    Lactation Tools Discussed/Used Tools: Pump Breast pump type: Double-Electric Breast Pump Pump Education: Setup, frequency, and cleaning;Milk Storage Reason for Pumping: stimulation; low sugars Pumping frequency: q 3 hours  Interventions Interventions: Breast feeding basics reviewed;Education;DEBP  Discharge    Consult Status Consult Status: Follow-up Date: 04/20/21 Follow-up type: In-patient    Tammy Allen 04/20/2021, 12:03 PM

## 2021-04-20 NOTE — Progress Notes (Signed)
Pt with non reassuring fetal monitoring . TAchycardia and chorio . I have counseled pt regarding the role of cesarean . All questions answered.proceed

## 2021-04-20 NOTE — Progress Notes (Signed)
Post Partum Day 0 Subjective: Doing well, no complaints.  Tolerating regular diet, pain with PO meds, has not been out of bed yet, Foley catheter in place.  No CP SOB Fever,Chills, N/V or leg pain; denies nipple or breast pain, no HA change of vision, RUQ/epigastric pain  Objective: BP 115/73 (BP Location: Right Arm)    Pulse 63    Temp 97.8 F (36.6 C) (Oral)    Resp 18    Ht 5\' 5"  (1.651 m)    Wt 105.4 kg    LMP 07/17/2020 (Exact Date)    SpO2 98%    Breastfeeding Unknown    BMI 38.67 kg/m    Physical Exam:  General: NAD Breasts: soft/nontender CV: RRR Pulm: nl effort, CTABL Abdomen: soft, NT, BS x 4 Incision: Dsg CDI/no erythema or drainage Lochia: moderate Uterine Fundus: fundus firm and 1 fb below umbilicus DVT Evaluation: no cords, ttp LEs   Recent Labs    04/19/21 0739 04/20/21 0628  HGB 12.5 11.4*  HCT 35.7* 32.9*  WBC 11.3* 23.3*  PLT 140* 139*    Assessment/Plan: 26 y.o. G1P1001 postpartum day # 1  - Continue routine PP care, remove Foley and assist to stand and walk as needed. - Lactation consult - Discussed contraceptive options including implant, IUDs hormonal and non-hormonal, injection, pills/ring/patch, condoms, and NFP.  - Acute blood loss anemia - hemodynamically stable and asymptomatic; start po ferrous sulfate BID with stool softeners  - Immunization status: all Imms up to date  Disposition: Does not desire Dc home today.    30, CNM 04/20/2021 10:51 AM

## 2021-04-20 NOTE — Brief Op Note (Signed)
04/18/2021 - 04/20/2021  12:49 AM  PATIENT:  Tammy Allen  26 y.o. female  PRE-OPERATIVE DIAGNOSIS:  non reassuring fetal monitoring   POST-OPERATIVE DIAGNOSIS: non reassuring fetal monitoring  CPD  PROCEDURE:  Procedure(s): CESAREAN SECTION LTCS  SURGEON:  Surgeon(s) and Role:    * Brynden Thune, Ihor Austin, MD - Primary  PHYSICIAN ASSISTANT: Heloise Ochoa , CNM   ASSISTANTS: none   ANESTHESIA:   epidural  EBL:  qbl 1025 cc  BLOOD ADMINISTERED:none  DRAINS: Urinary Catheter (Foley)   LOCAL MEDICATIONS USED:  MARCAINE     SPECIMEN:  No Specimen  DISPOSITION OF SPECIMEN:  N/A  COUNTS:  YES  TOURNIQUET:  * No tourniquets in log *  DICTATION: .Other Dictation: Dictation Number verbal  PLAN OF CARE: Admit to inpatient   PATIENT DISPOSITION:  PACU - hemodynamically stable.   Delay start of Pharmacological VTE agent (>24hrs) due to surgical blood loss or risk of bleeding: not applicable

## 2021-04-20 NOTE — Transfer of Care (Signed)
Immediate Anesthesia Transfer of Care Note  Patient: Gwenn Teodoro  Procedure(s) Performed: CESAREAN SECTION  Patient Location: Mother/Baby  Anesthesia Type:Epidural  Level of Consciousness: awake, alert  and oriented  Airway & Oxygen Therapy: Patient Spontanous Breathing  Post-op Assessment: Report given to RN and Post -op Vital signs reviewed and stable  Post vital signs: Reviewed and stable  Last Vitals:  Vitals Value Taken Time  BP    Temp    Pulse 93 04/20/21 0107  Resp 26 04/20/21 0107  SpO2 93 % 04/20/21 0107    Last Pain:  Vitals:   04/19/21 2315  TempSrc: Axillary  PainSc:       Patients Stated Pain Goal: 0 (04/19/21 1301)  Complications: No notable events documented.

## 2021-04-20 NOTE — Anesthesia Postprocedure Evaluation (Signed)
Anesthesia Post Note  Patient: Tammy Allen  Procedure(s) Performed: CESAREAN SECTION  Patient location during evaluation: Mother Baby Anesthesia Type: Epidural Level of consciousness: oriented and awake and alert Pain management: pain level controlled Vital Signs Assessment: post-procedure vital signs reviewed and stable Respiratory status: spontaneous breathing and respiratory function stable Cardiovascular status: blood pressure returned to baseline and stable Postop Assessment: no headache, no backache, no apparent nausea or vomiting and able to ambulate Anesthetic complications: no   No notable events documented.   Last Vitals:  Vitals:   04/20/21 0531 04/20/21 0740  BP: 133/75 115/73  Pulse: 64 63  Resp: 18 18  Temp: 36.6 C 36.6 C  SpO2: 98% 98%    Last Pain:  Vitals:   04/20/21 0740  TempSrc: Oral  PainSc:                  Starling Manns

## 2021-04-21 ENCOUNTER — Encounter: Payer: Self-pay | Admitting: Advanced Practice Midwife

## 2021-04-21 LAB — CBC
HCT: 27.6 % — ABNORMAL LOW (ref 36.0–46.0)
Hemoglobin: 9.7 g/dL — ABNORMAL LOW (ref 12.0–15.0)
MCH: 31.1 pg (ref 26.0–34.0)
MCHC: 35.1 g/dL (ref 30.0–36.0)
MCV: 88.5 fL (ref 80.0–100.0)
Platelets: 136 10*3/uL — ABNORMAL LOW (ref 150–400)
RBC: 3.12 MIL/uL — ABNORMAL LOW (ref 3.87–5.11)
RDW: 12.9 % (ref 11.5–15.5)
WBC: 18.1 10*3/uL — ABNORMAL HIGH (ref 4.0–10.5)
nRBC: 0 % (ref 0.0–0.2)

## 2021-04-21 MED ORDER — ACETAMINOPHEN 500 MG PO TABS
1000.0000 mg | ORAL_TABLET | Freq: Four times a day (QID) | ORAL | 0 refills | Status: AC | PRN
Start: 2021-04-21 — End: ?

## 2021-04-21 MED ORDER — IBUPROFEN 600 MG PO TABS
600.0000 mg | ORAL_TABLET | Freq: Four times a day (QID) | ORAL | 1 refills | Status: AC | PRN
Start: 2021-04-21 — End: ?

## 2021-04-21 MED ORDER — OXYCODONE HCL 5 MG PO TABS: 5.0000 mg | ORAL_TABLET | ORAL | 0 refills | Status: AC | PRN

## 2021-04-21 NOTE — Lactation Note (Signed)
This note was copied from a baby's chart. Lactation Consultation Note  Patient Name: Tammy Allen [id number] Date: 04/21/2021 Reason for consult: Follow-up assessment;Primapara;Early term 37-38.6wks Age:24 hours  Lactation follow-up. Baby's sugar levels have stabilized and have remained above 40. Mom continues to put baby to breast and supplement with formula for "top off". Mom states that baby is staying at breast longer and longer with each feeding. Praised mom for continued dedication to breastfeeding and encouraged to continue offering breast first at each feeding and monitoring hunger and satiety cues for need of supplement. Encouraged to call out with any questions/concerns.   Maternal Data Has patient been taught Hand Expression?: Yes Does the patient have breastfeeding experience prior to this delivery?: No  Feeding Mother's Current Feeding Choice: Breast Milk Nipple Type: Slow - flow  LATCH Score                    Lactation Tools Discussed/Used Tools: Pump;Bottle Breast pump type: Double-Electric Breast Pump Reason for Pumping: stimulation  Interventions Interventions: Breast feeding basics reviewed;Education;Pace feeding  Discharge    Consult Status Consult Status: Follow-up Date: 04/21/21 Follow-up type: In-patient    Danford Bad 04/21/2021, 10:30 AM

## 2021-04-21 NOTE — Discharge Instructions (Signed)
Cesarean Delivery, Care After Refer to this sheet in the next few weeks. These instructions provide you with information on caring for yourself after your procedure. Your health care provider may also give you specific instructions. Your treatment has been planned according to current medical practices, but problems sometimes occur. Call your health care provider if you have any problems or questions after you go home. HOME CARE INSTRUCTIONS  Please leave honey comb dressing (OP Site) on for 7 days.  You may shower during this period but turn your back to the water so that the dressing does not get directly saturated by the water.   You may take the dressing off on day 7.  The easiest way to do it is in the shower.  Allow the water to run over the dressing and it usually comes off easier.   Only take over-the-counter or prescription medications as directed by your health care provider. Do not drink alcohol, especially if you are breastfeeding or taking medication to relieve pain. Do not  smoke tobacco. Continue to use good perineal care. Good perineal care includes: Wiping your perineum from front to back. Keeping your perineum clean. Check your surgical cut (incision) daily for increased redness, drainage, swelling, or separation of skin. Shower and clean your incision gently with soap and water every day, by letting warm and soapy water run over the incision, and then pat it dry. If your health care provider says it is okay, leave the incision uncovered. Use a bandage (dressing) if the incision is draining fluid or appears irritated. If the adhesive strips across the incision do not fall off within 7 days, carefully peel them off, after a shower. Hug a pillow when coughing or sneezing until your incision is healed. This helps to relieve pain. Do not use tampons, douches or have sexual intercourse, until your health care provider says it is okay. Wear a well-fitting bra that provides breast  support. Limit wearing support panties or control-top hose. Drink enough fluids to keep your urine clear or pale yellow. Eat high-fiber foods such as whole grain cereals and breads, brown rice, beans, and fresh fruits and vegetables every day. These foods may help prevent or relieve constipation. Resume activities such as climbing stairs, driving, lifting, exercising, or traveling as directed by your health care provider. Try to have someone help you with your household activities and your newborn for at least a few days after you leave the hospital. Rest as much as possible. Try to rest or take a nap when your newborn is sleeping. Increase your activities gradually. Do not lift more than 15lbs until directed by a provider. Keep all of your scheduled postpartum appointments. It is very important to keep your scheduled follow-up appointments. At these appointments, your health care provider will be checking to make sure that you are healing physically and emotionally. SEEK MEDICAL CARE IF:  You are passing large clots from your vagina. Save any clots to show your health care provider. You have a foul smelling discharge from your vagina. You have trouble urinating. You are urinating frequently. You have pain when you urinate. You have a change in your bowel movements. You have increasing redness, pain, or swelling near your incision. You have pus draining from your incision. Your incision is separating. You have painful, hard, or reddened breasts. You have a severe headache. You have blurred vision or see spots. You feel sad or depressed. You have thoughts of hurting yourself or your newborn. You have questions   about your care, the care of your newborn, or medications. You are dizzy or light-headed. You have a rash. You have pain, redness, or swelling at the site of the removed intravenous access (IV) tube. You have nausea or vomiting. You stopped breastfeeding and have not had a  menstrual period within 12 weeks of stopping. You are not breastfeeding and have not had a menstrual period within 12 weeks of delivery. You have a fever. SEEK IMMEDIATE MEDICAL CARE IF: You have persistent pain. You have chest pain. You have shortness of breath. You faint. You have leg pain. You have stomach pain. Your vaginal bleeding saturates 2 or more sanitary pads in 1 hour. MAKE SURE YOU:  Understand these instructions. Will watch your condition. Will get help right away if you are not doing well or get worse. Document Released: 12/19/2001 Document Revised: 08/13/2013 Document Reviewed: 11/24/2011 ExitCare Patient Information 2015 [employer]. This information is not intended to replace advice given to you by your health care provider. Make sure you discuss any questions you have with your health care provider.  

## 2021-04-21 NOTE — Progress Notes (Signed)
Mother discharged.  Discharge instructions given.  Mother verbalizes understanding.  Transported by nursing staff.  

## 2021-04-21 NOTE — Progress Notes (Signed)
Postop Day  1  Subjective: no complaints, up ad lib, voiding, and tolerating PO  Doing well, no concerns. Ambulating without difficulty, pain managed with PO meds, tolerating regular diet, and voiding without difficulty.   No fever/chills, chest pain, shortness of breath, nausea/vomiting, or leg pain. No nipple or breast pain. No headache, visual changes, or RUQ/epigastric pain.  Objective: BP 134/78 (BP Location: Left Arm)    Pulse 65    Temp 98.2 F (36.8 C) (Oral)    Resp 18    Ht 5' 5"  (1.651 m)    Wt 105.4 kg    LMP 07/17/2020 (Exact Date)    SpO2 99%    Breastfeeding Unknown    BMI 38.67 kg/m    Physical Exam:  General: alert, cooperative, and no distress Breasts: soft/nontender CV: RRR Pulm: nl effort, CTABL Abdomen: soft, non-tender, active bowel sounds Uterine Fundus: firm Incision: no significant drainage, covered with pressure dressing Perineum: minimal edema, intact Lochia: appropriate DVT Evaluation: No evidence of DVT seen on physical exam.  Recent Labs    04/20/21 0628 04/21/21 0811  HGB 11.4* 9.7*  HCT 32.9* 27.6*  WBC 23.3* 18.1*  PLT 139* 136*    Assessment/Plan: 26 y.o. G1P1001 postpartum day # 1  -Continue routine postpartum care -Encouraged snug fitting bra, cold application, Tylenol PRN, and cabbage leaves for engorgement for formula feeding  -Acute blood loss anemia - hemodynamically stable and asymptomatic; start PO ferrous sulfate BID with stool softeners  -Immunization status:   all immunizations up to date -Up to shower today. -Change current pressure dressing to occlusive OP Site honey comb dressing - leave on for 5 days    Disposition: Continue inpatient postpartum care.  Potential d/c later this evening if all discharge milestones are met for couplet.    LOS: 3 days   Minda Meo, Mallory Shirk 04/21/2021, 10:51 AM   ----- Drinda Butts  Certified Nurse Midwife [facility]

## 2021-04-22 ENCOUNTER — Telehealth: Payer: Self-pay | Admitting: Family Medicine

## 2021-04-22 NOTE — Telephone Encounter (Signed)
Returned patient phone call. Patient has scheduled [facility] appt for 04/24/2021. Patient informed RN that she has delivered her baby and has a follow up appt with [facility] 05/04/21. 04/24/2021 appt here has been cancelled. Encouraged patient to schedule PP appt. Hal Morales, RN

## 2021-04-22 NOTE — Telephone Encounter (Signed)
Pt wants to talk to a nurse, but did not want to specify or give any details in regard to her concerns. Please call.

## 2021-04-24 ENCOUNTER — Ambulatory Visit: Payer: Medicaid Other

## 2021-04-29 ENCOUNTER — Inpatient Hospital Stay: Admit: 2021-04-29 | Payer: Self-pay

## 2021-11-13 IMAGING — US US OB COMP LESS 14 WK
1 series · 14 of 28 positions shown · non-contrast
Comparison: None.

CLINICAL DATA: New pregnancy crampy lower abdominal discomfort

EXAM:
OBSTETRIC <14 WK US AND TRANSVAGINAL OB US
TECHNIQUE: Both transabdominal and transvaginal ultrasound examinations were
performed for complete evaluation of the gestation as well as the
maternal uterus, adnexal regions, and pelvic cul-de-sac.
Transvaginal technique was performed to assess early pregnancy.

[Series 1: us ob transvaginal · 14 of 43 slices shown]
[im 2/43]
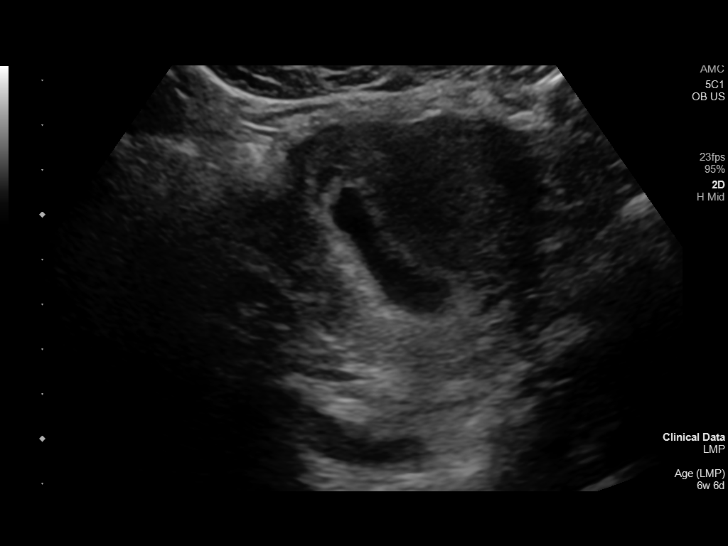
[im 5/43]
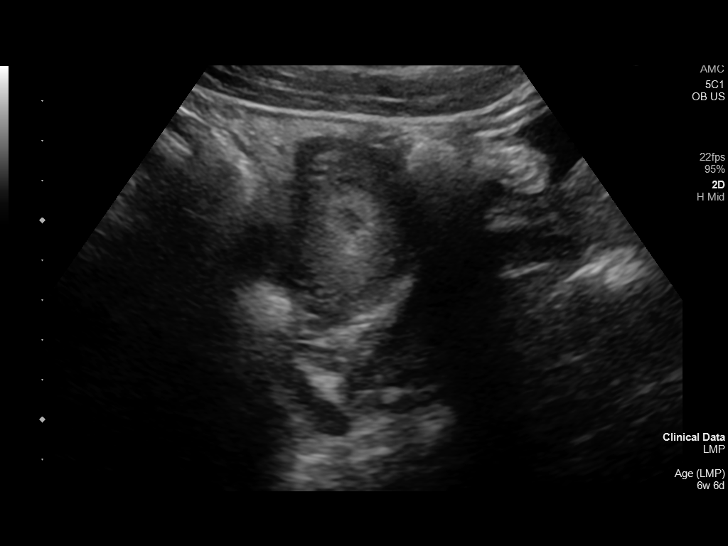
[im 8/43]
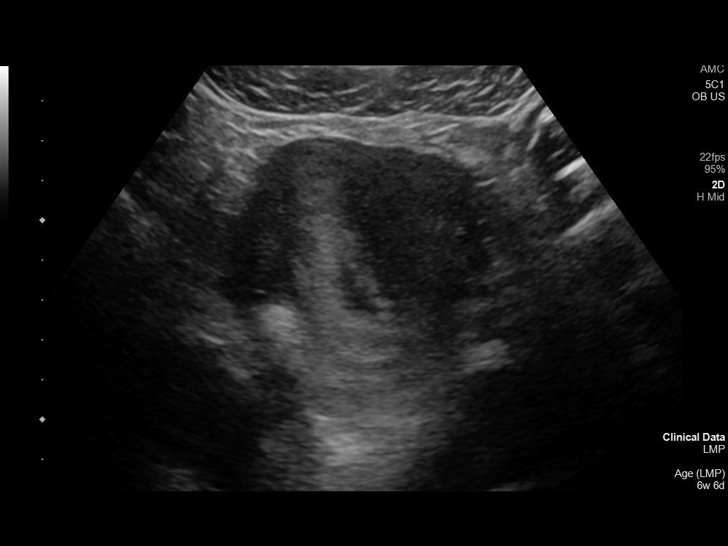
[im 11/43]
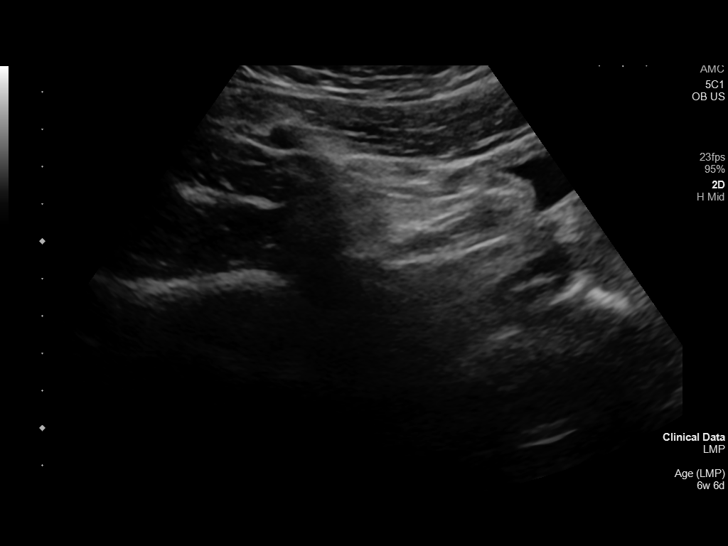
[im 15/43]
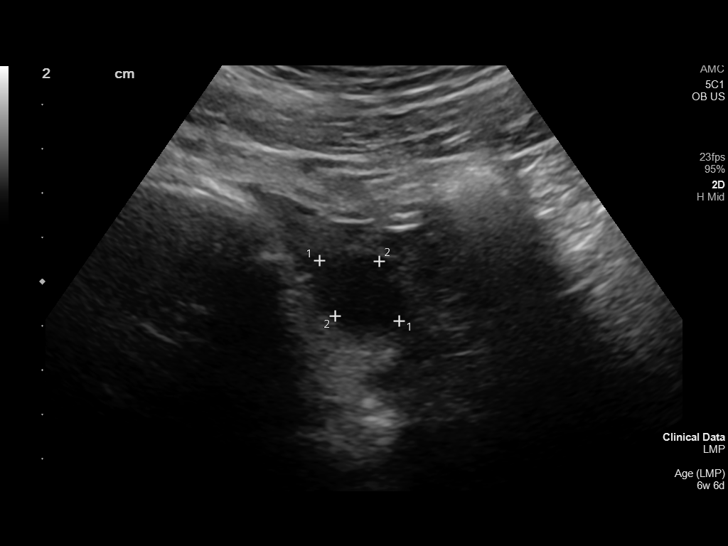
[im 18/43]
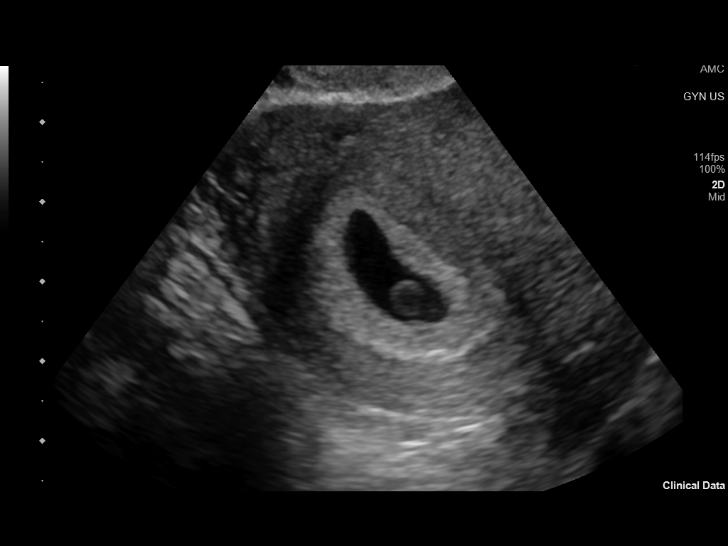
[im 21/43]
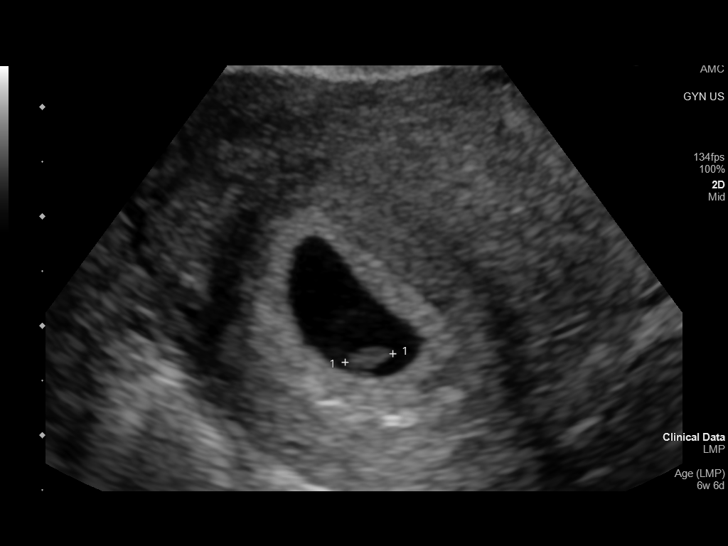
[im 24/43]
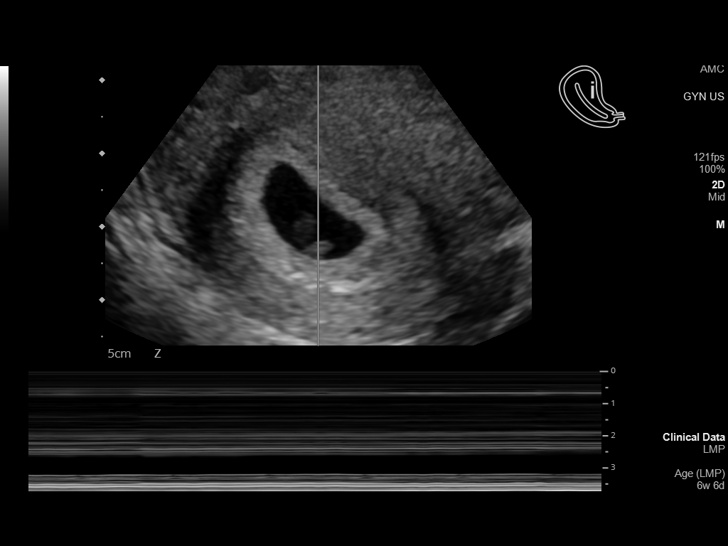
[im 27/43]
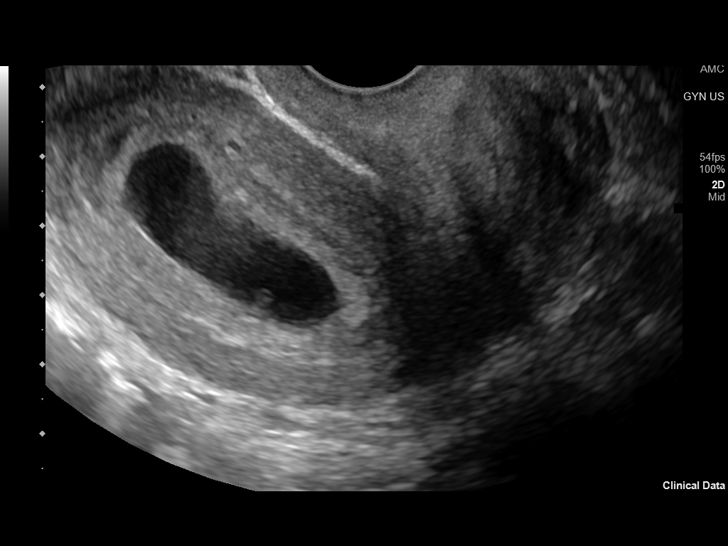
[im 30/43]
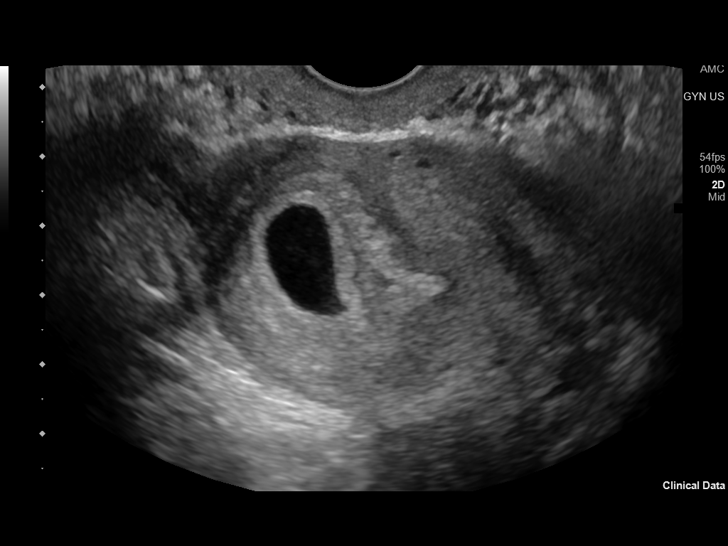
[im 33/43]
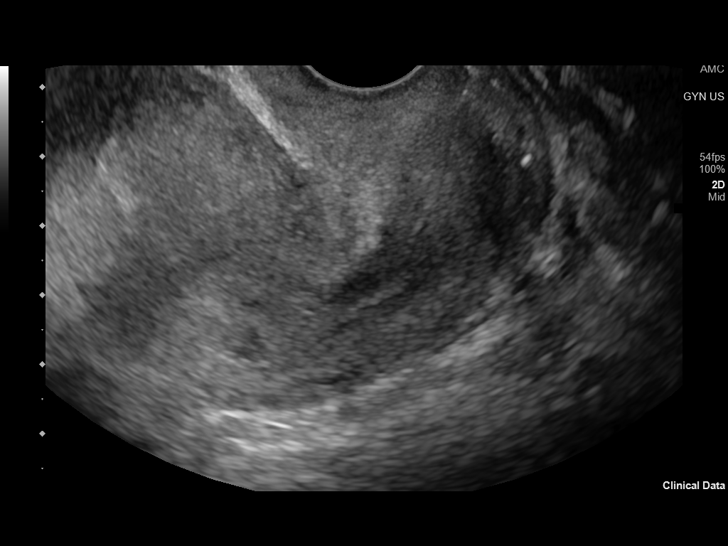
[im 36/43]
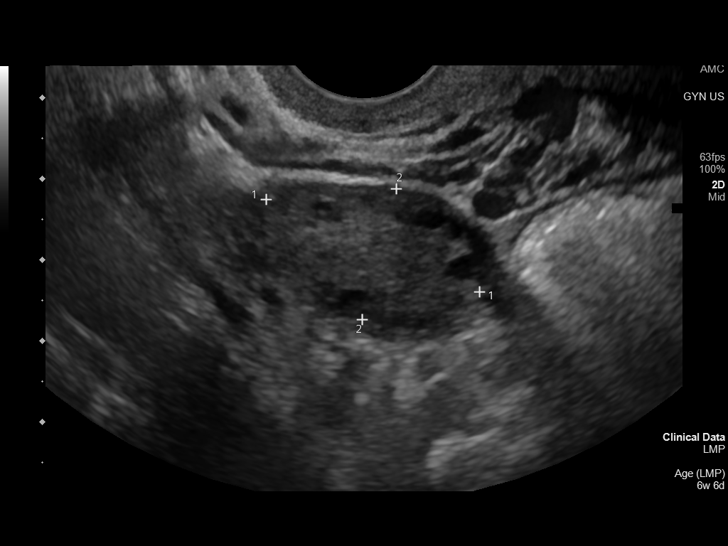
[im 39/43]
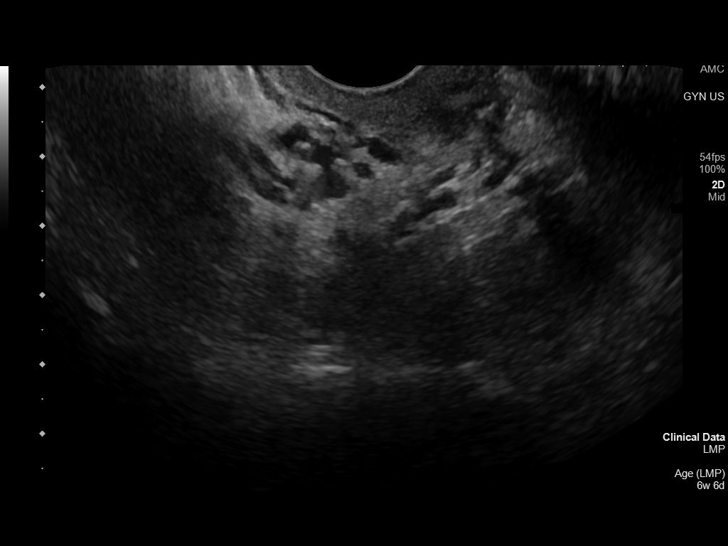
[im 43/43]
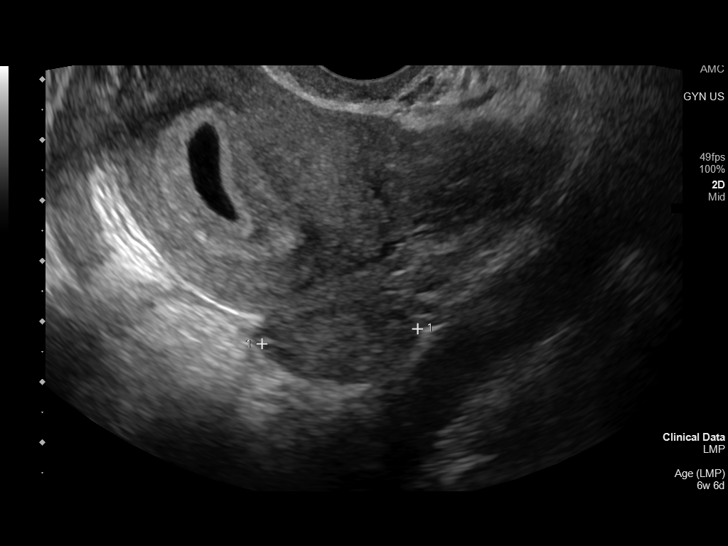

[14 of 28 positions shown; findings below may reference images not displayed]

FINDINGS: Intrauterine gestational sac: Single

Yolk sac:  Visualized

Embryo:  Visualized

Cardiac Activity: Visualized

Heart Rate: 110 bpm

CRL: 4.3 mm   6 w   1 d                  US EDC: 04/29/2021

Subchorionic hemorrhage:  None visualized.

Maternal uterus/adnexae: Ovaries are within normal limits. Right
ovary measures 2.9 x 1.7 x 1.6 cm. Left ovary measures 2 x 1.2 x
cm. No significant free fluid.
IMPRESSION: Single viable intrauterine pregnancy as above. No specific
abnormality is seen

## 2021-11-13 IMAGING — US US OB TRANSVAGINAL
1 series · 14 of 28 positions shown · non-contrast
Comparison: None.

CLINICAL DATA: New pregnancy crampy lower abdominal discomfort

EXAM:
OBSTETRIC <14 WK US AND TRANSVAGINAL OB US
TECHNIQUE: Both transabdominal and transvaginal ultrasound examinations were
performed for complete evaluation of the gestation as well as the
maternal uterus, adnexal regions, and pelvic cul-de-sac.
Transvaginal technique was performed to assess early pregnancy.

[Series 1: us ob transvaginal · 14 of 43 slices shown]
[im 2/43]
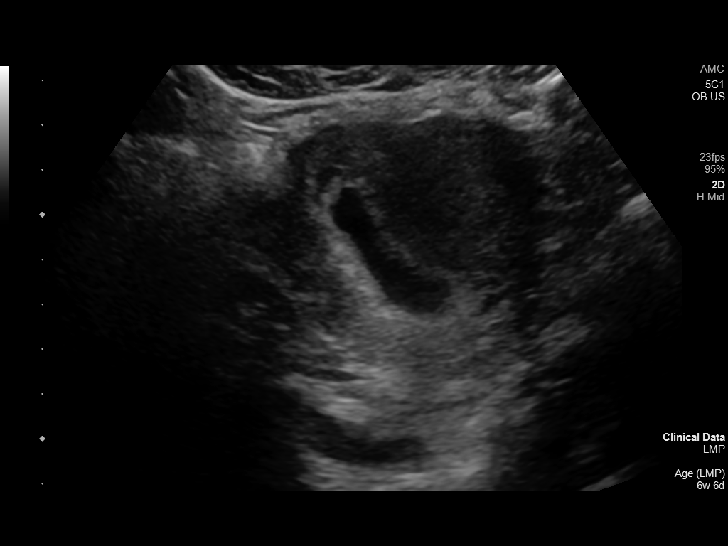
[im 5/43]
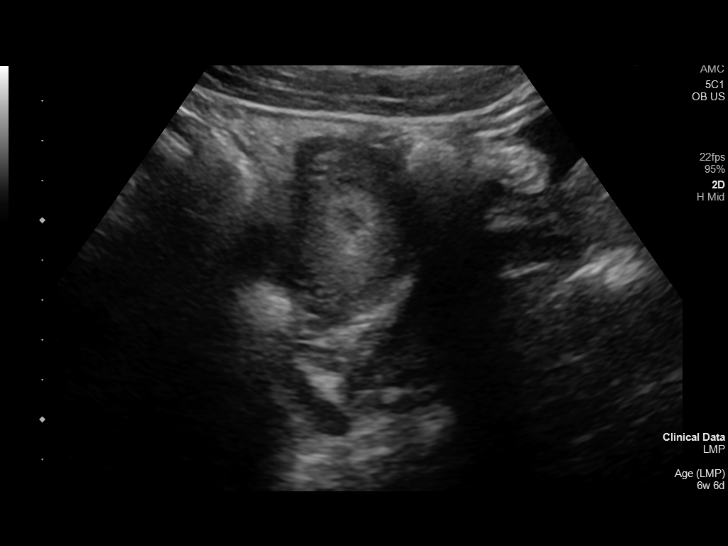
[im 8/43]
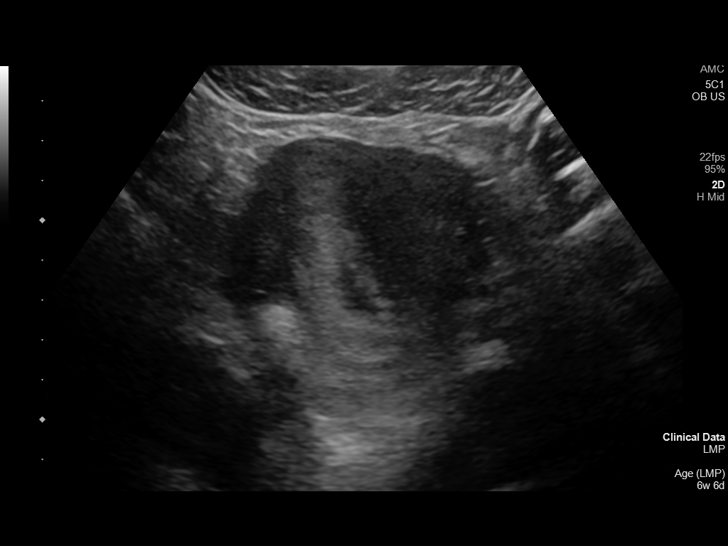
[im 11/43]
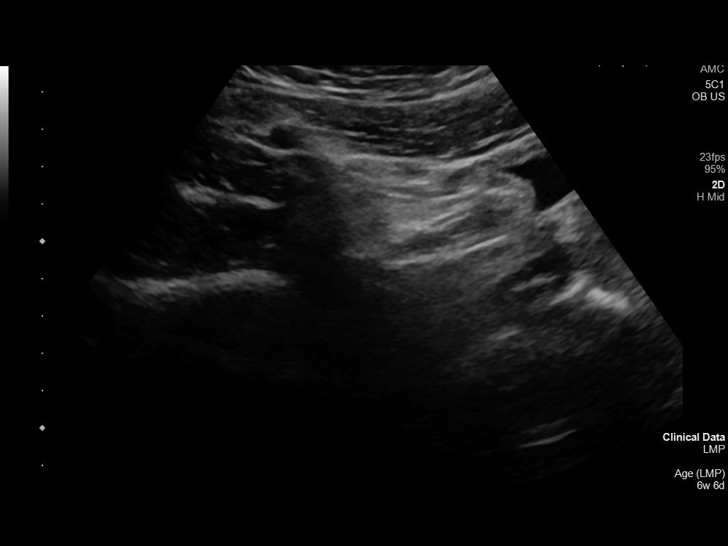
[im 15/43]
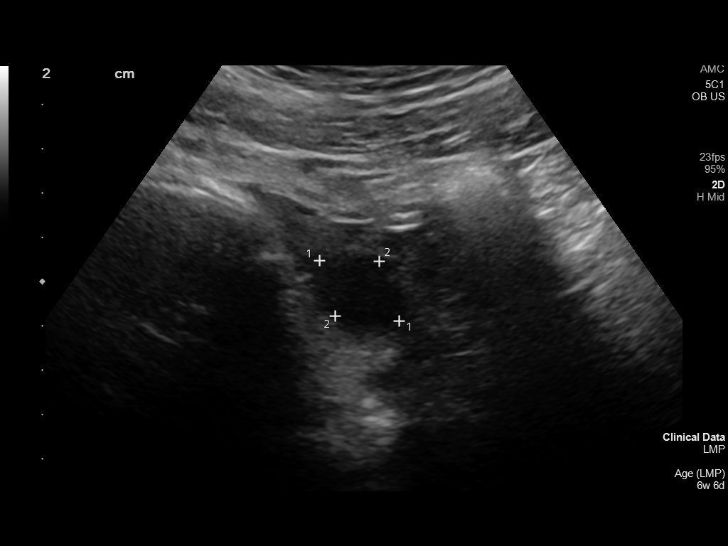
[im 18/43]
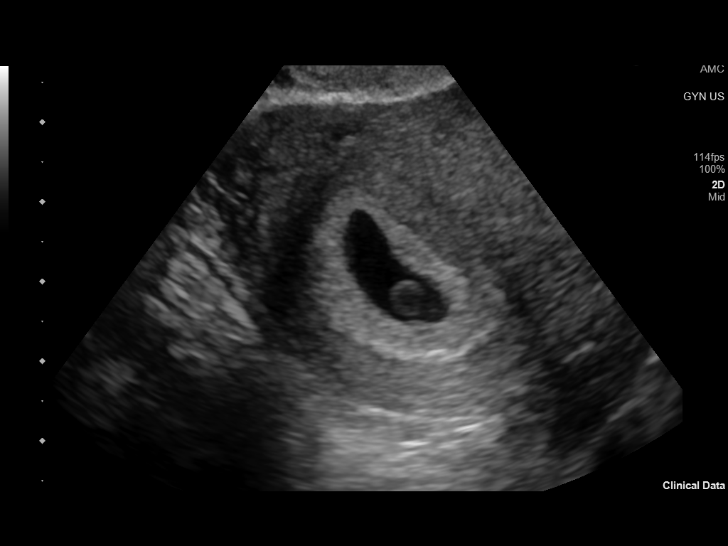
[im 21/43]
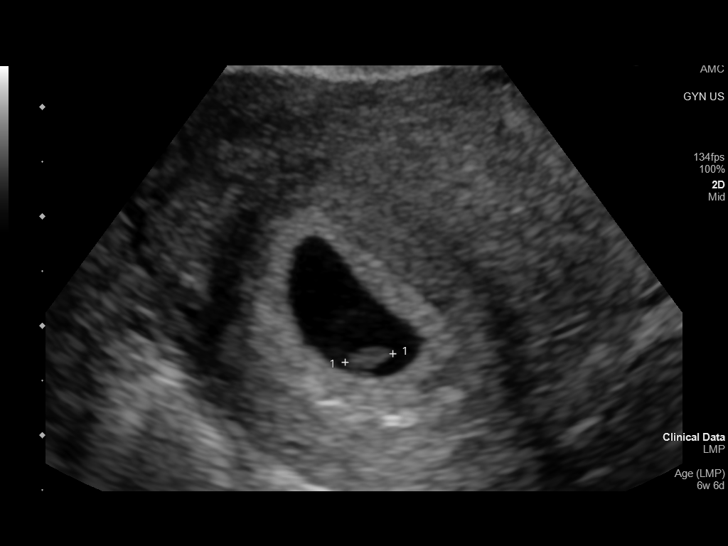
[im 24/43]
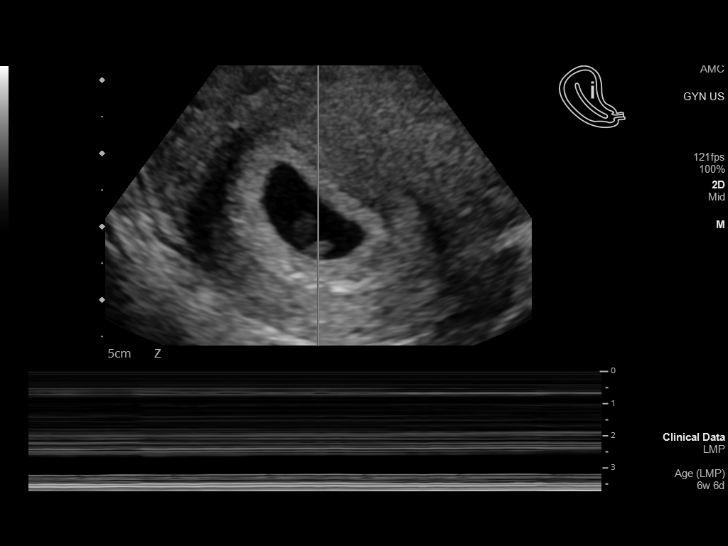
[im 27/43]
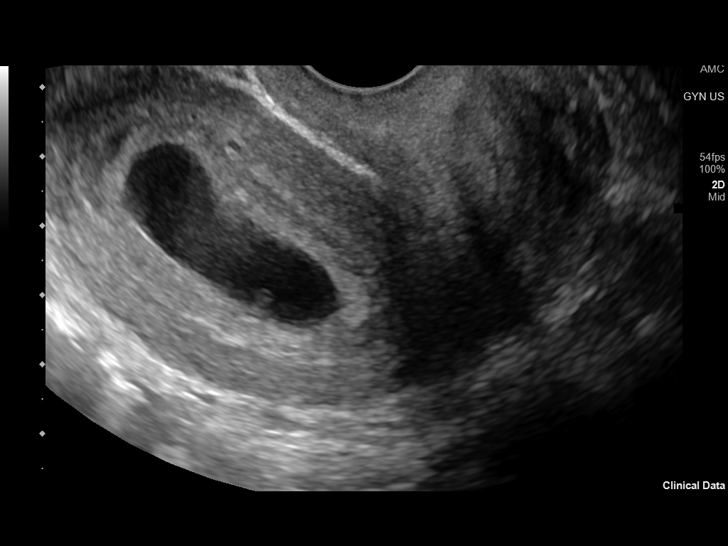
[im 30/43]
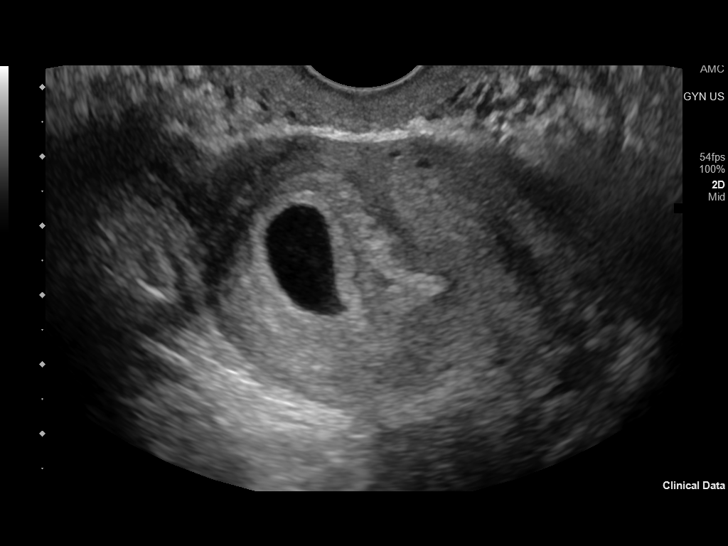
[im 33/43]
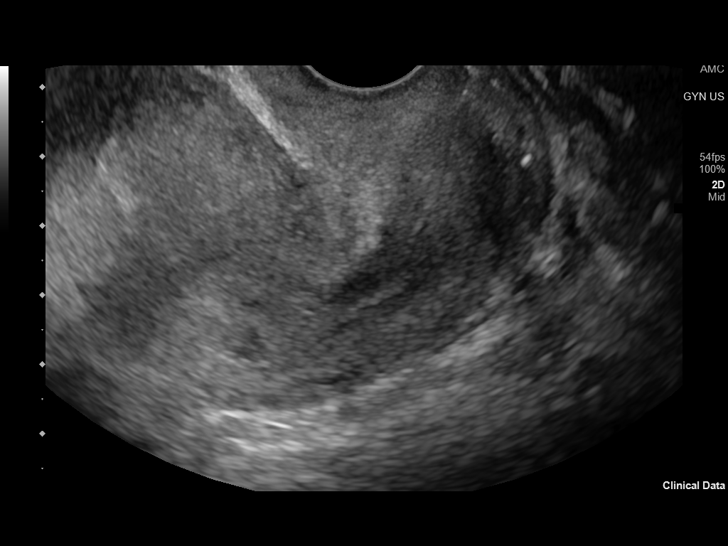
[im 36/43]
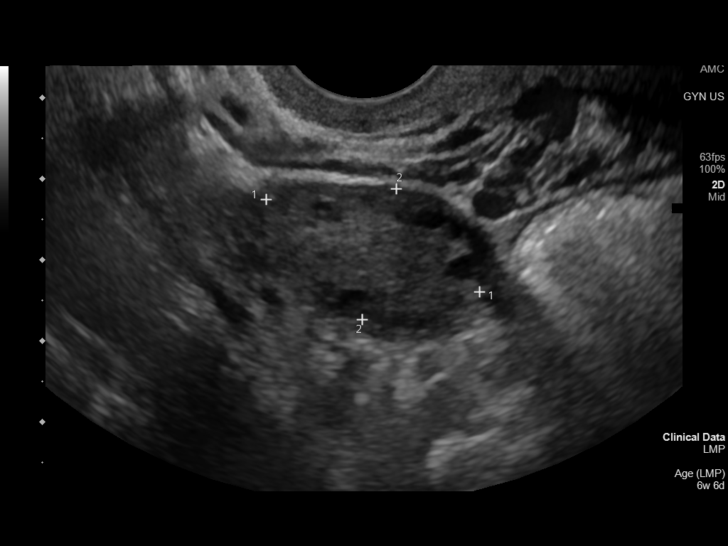
[im 39/43]
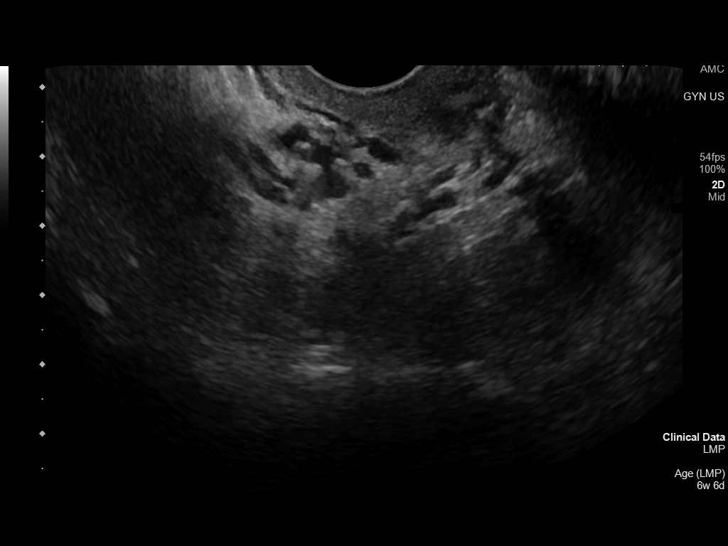
[im 43/43]
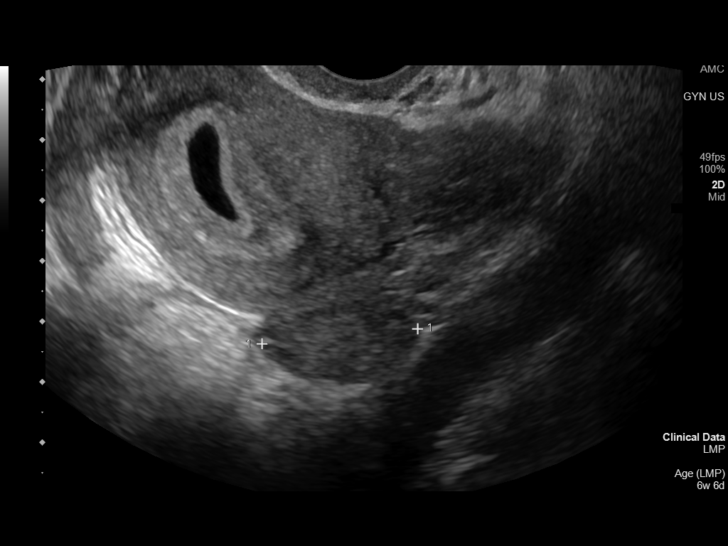

[14 of 28 positions shown; findings below may reference images not displayed]

FINDINGS: Intrauterine gestational sac: Single

Yolk sac:  Visualized

Embryo:  Visualized

Cardiac Activity: Visualized

Heart Rate: 110 bpm

CRL: 4.3 mm   6 w   1 d                  US EDC: 04/29/2021

Subchorionic hemorrhage:  None visualized.

Maternal uterus/adnexae: Ovaries are within normal limits. Right
ovary measures 2.9 x 1.7 x 1.6 cm. Left ovary measures 2 x 1.2 x
cm. No significant free fluid.
IMPRESSION: Single viable intrauterine pregnancy as above. No specific
abnormality is seen

## 2022-03-10 IMAGING — US US MFM OB DETAIL+14 WK
1 series · 13 of 28 positions shown · non-contrast
Comparison: none

[Series 1: us mfm ob detail+14 wk · 13 of 78 slices shown]
[im 3/78]
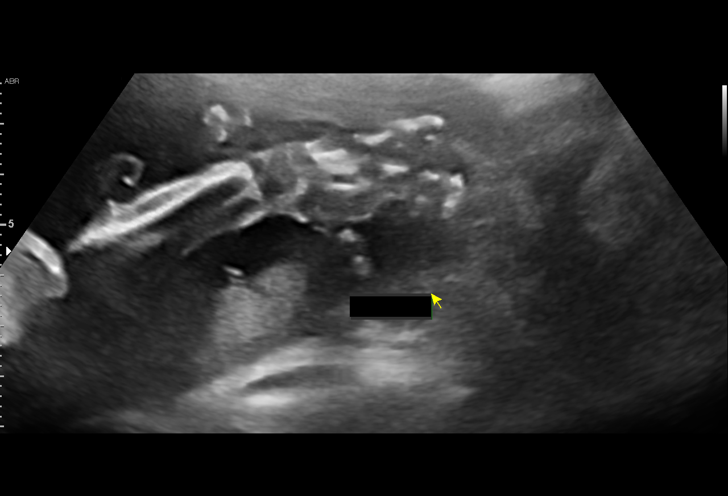
[im 9/78]
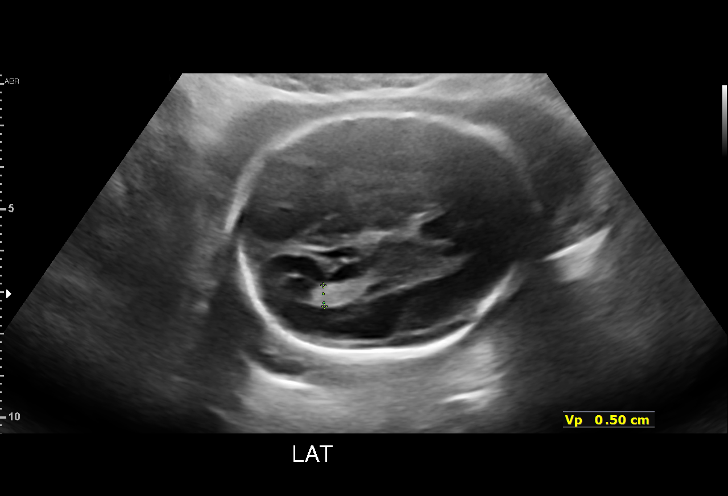
[im 15/78]
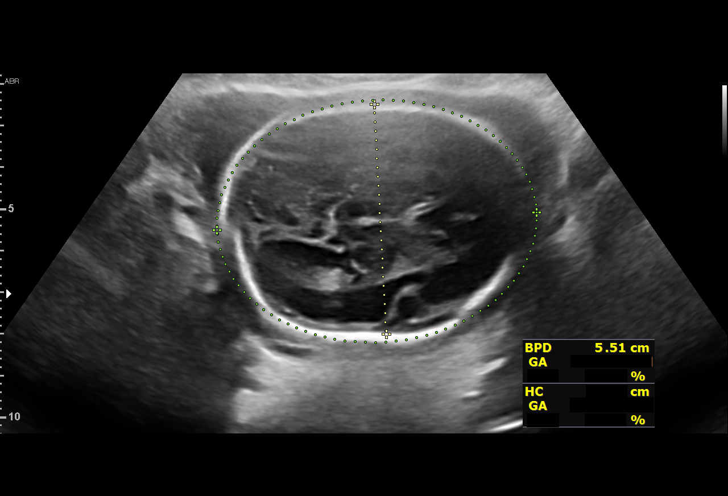
[im 20/78]
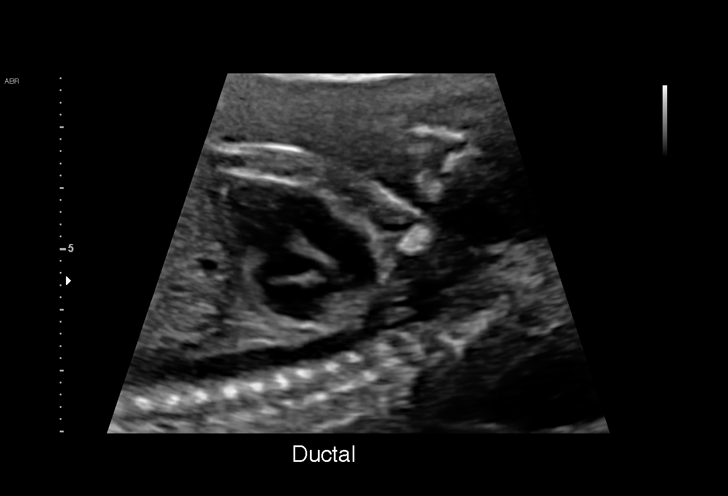
[im 26/78]
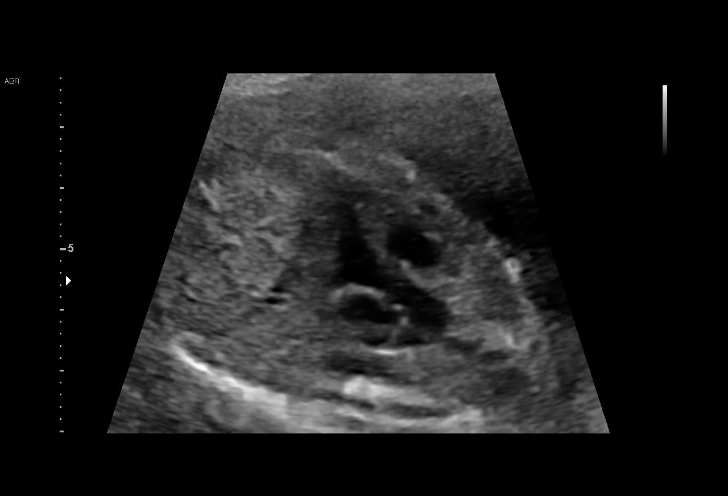
[im 32/78]
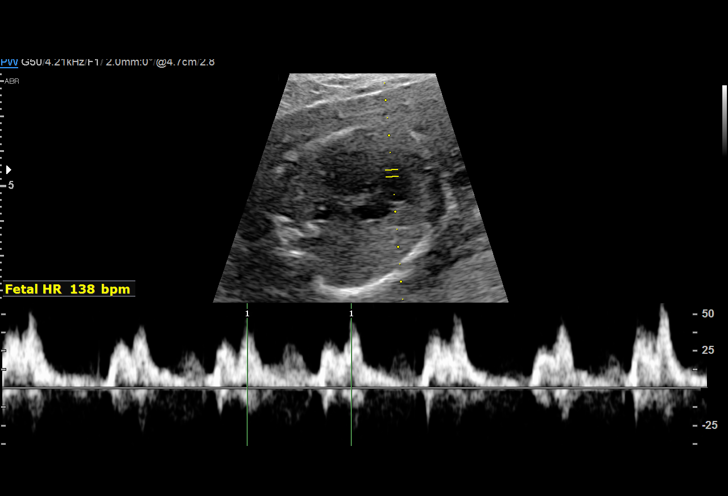
[im 40/78]
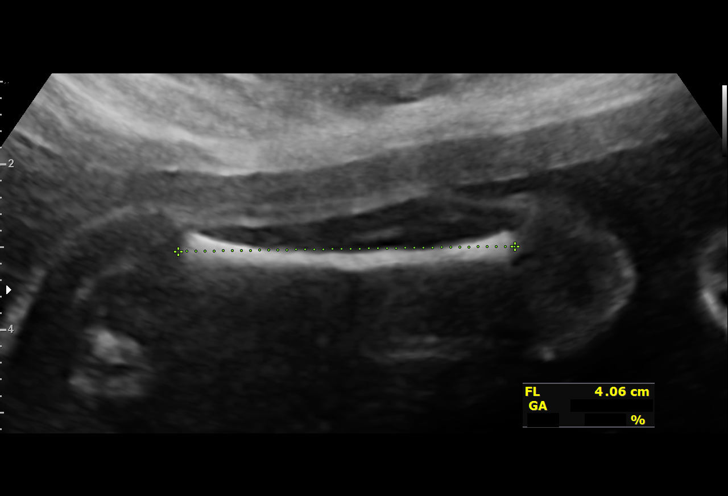
[im 46/78]
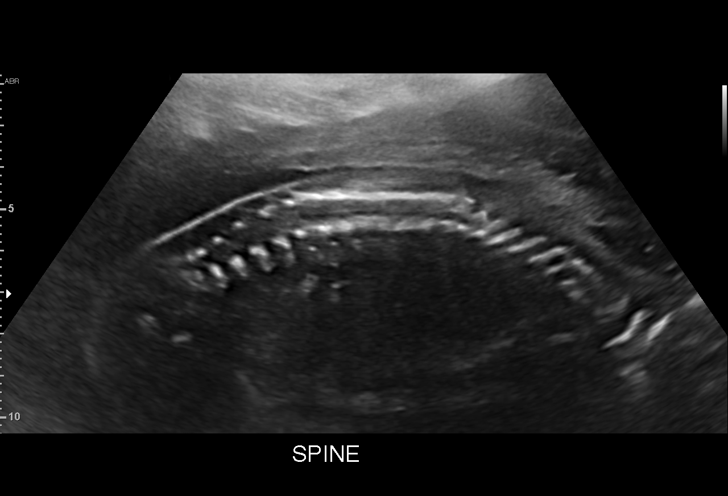
[im 52/78]
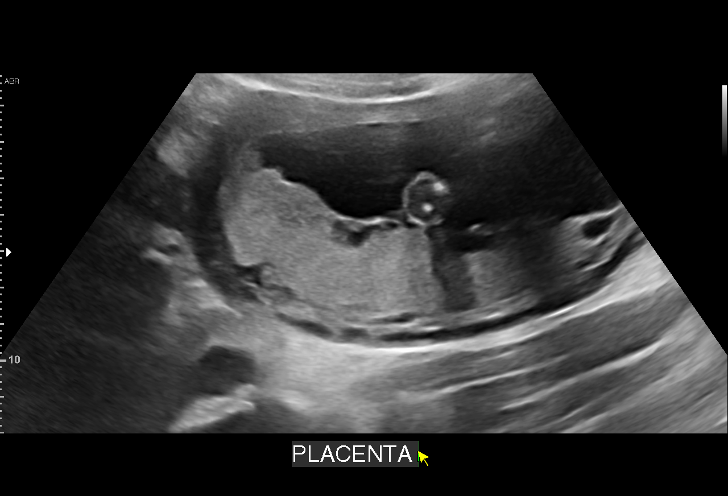
[im 58/78]
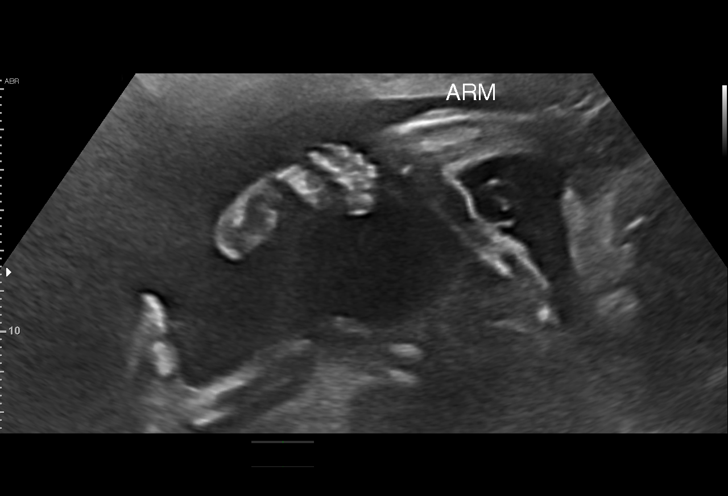
[im 63/78]
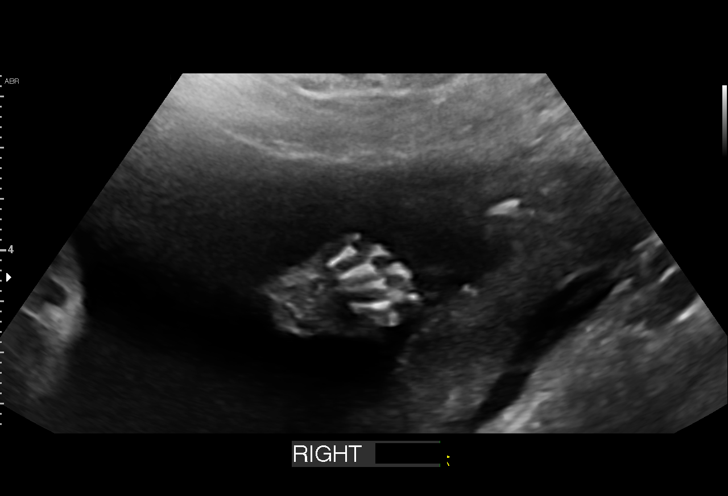
[im 69/78]
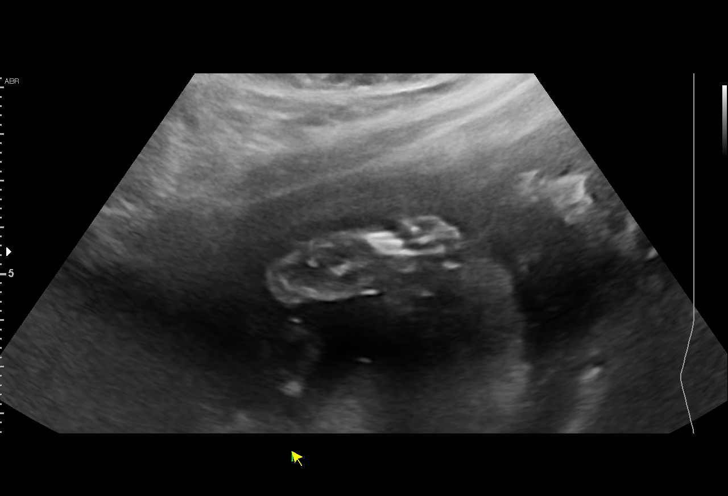
[im 75/78]
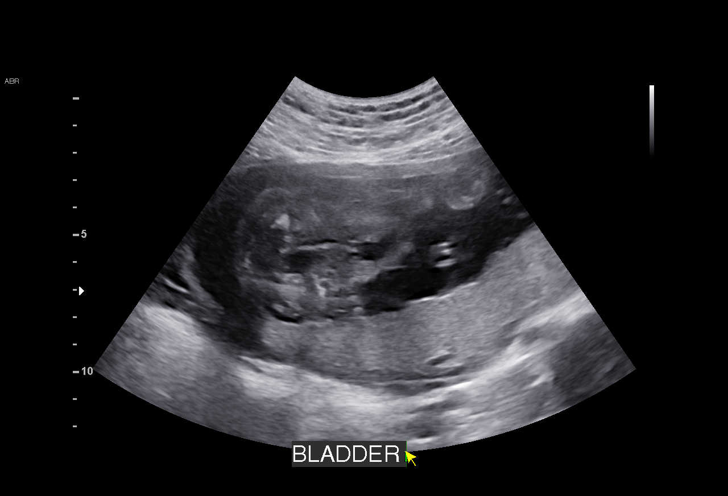

[13 of 28 positions shown; findings below may reference images not displayed]

[facility] CNM

Indications

 Obesity complicating pregnancy, second
 trimester
 22 weeks gestation of pregnancy
 Encounter for antenatal screening for
 malformations
 Low Risk NIPS
Fetal Evaluation

 Num Of Fetuses:          1
 Fetal Heart Rate(bpm):   138
 Cardiac Activity:        Observed
 Presentation:            Cephalic
 Placenta:                Posterior
 P. Cord Insertion:       Visualized, central

 Amniotic Fluid
 AFI FV:      Within normal limits

                             Largest Pocket(cm)

Biometry

 BPD:      55.5  mm     G. Age:  22w 6d         48  %    CI:        66.99   %    70 - 86
                                                         FL/HC:       17.9  %    19.2 -
 HC:      217.2  mm     G. Age:  23w 5d         72  %    HC/AC:       1.22       1.05 -
 AC:      178.7  mm     G. Age:  22w 5d         38  %    FL/BPD:      70.1  %    71 - 87
 FL:       38.9  mm     G. Age:  22w 3d         27  %    FL/AC:       21.8  %    20 - 24
 HUM:        39  mm     G. Age:  23w 6d         66  %
 CER:      25.6  mm     G. Age:  23w 2d         86  %
 LV:          5  mm
 CM:          6  mm

 Est. FW:     531   gm     1 lb 3 oz     37  %
OB History

 Gravidity:    1
 Living:       0
Gestational Age

 LMP:           23w 5d        Date:  07/17/20                 EDD:   04/23/21
 U/S Today:     23w 0d                                        EDD:   04/28/21
 Best:          22w 6d     Det. By:  Early Ultrasound         EDD:   04/29/21
                                     (09/04/20)
Anatomy

 Cranium:               Appears normal         Aortic Arch:            Limited
 Cavum:                 Appears normal         Ductal Arch:            Appears normal
 Ventricles:            Appears normal         Diaphragm:              Appears normal
 Choroid Plexus:        Appears normal         Stomach:                Appears normal, left
                                                                       sided
 Cerebellum:            Appears normal         Abdomen:                Appears normal
 Posterior Fossa:       Appears normal         Abdominal Wall:         Appears nml (cord
                                                                       insert, abd wall)
 Nuchal Fold:           Appears normal         Cord Vessels:           Appears normal (3
                                                                       vessel cord)
 Face:                  Appears normal         Kidneys:                Appear normal
                        (orbits and profile)
 Lips:                  Appears normal         Bladder:                Appears normal
 Thoracic:              Appears normal         Spine:                  Appears normal
 Heart:                 Appears normal         Upper Extremities:      Appears normal
                        (4CH, axis, and
                        situs)
 RVOT:                  Appears normal         Lower Extremities:      Appears normal
 LVOT:                  Appears normal

 Other:  Fetus appears to be a male.
Cervix Uterus Adnexa

 Cervix
 Length:           3.88  cm.
 Normal appearance by transabdominal scan.

 Right Ovary
 Within normal limits.

 Left Ovary
 Within normal limits.
Impression

 Single intrauterine pregnancy here for a detailed anatomy
 due to elevated BMI
 Normal anatomy with measurements consistent with dates
 There is good fetal movement and amniotic fluid volume
 Suboptimal views of the fetal anatomy were obtained
 secondary to fetal position.
Recommendations

 Follow up growth as clinically indicated.

## 2022-08-02 ENCOUNTER — Other Ambulatory Visit: Payer: Self-pay

## 2022-08-02 DIAGNOSIS — Z0283 Encounter for blood-alcohol and blood-drug test: Secondary | ICD-10-CM

## 2022-08-02 NOTE — Progress Notes (Signed)
Presents to [facility] [employer] [facility] for pre-employment drug screen for PT [employer] for [employer].  [facility] Acct #:  [id number] [facility] Specimen #:  [id number]   Rapid drug screen results = Negative  AMD

## 2022-09-01 ENCOUNTER — Emergency Department
Admission: EM | Admit: 2022-09-01 | Discharge: 2022-09-01 | Discharge status: Against Medical Advice | Payer: Medicaid Other | Attending: Emergency Medicine | Admitting: Emergency Medicine

## 2022-09-01 ENCOUNTER — Emergency Department
Admission: EM | Admit: 2022-09-01 | Discharge: 2022-09-01 | Disposition: A | Discharge status: Home / Self Care | Payer: No Typology Code available for payment source | Attending: Emergency Medicine | Admitting: Emergency Medicine

## 2022-09-01 ENCOUNTER — Emergency Department: Payer: No Typology Code available for payment source

## 2022-09-01 ENCOUNTER — Other Ambulatory Visit: Payer: Self-pay

## 2022-09-01 ENCOUNTER — Encounter: Payer: Self-pay | Admitting: Emergency Medicine

## 2022-09-01 DIAGNOSIS — Y92410 Unspecified street and highway as the place of occurrence of the external cause: Secondary | ICD-10-CM | POA: Diagnosis not present

## 2022-09-01 DIAGNOSIS — Z5321 Procedure and treatment not carried out due to patient leaving prior to being seen by health care provider: Secondary | ICD-10-CM | POA: Insufficient documentation

## 2022-09-01 DIAGNOSIS — Z041 Encounter for examination and observation following transport accident: Secondary | ICD-10-CM | POA: Insufficient documentation

## 2022-09-01 DIAGNOSIS — M25561 Pain in right knee: Secondary | ICD-10-CM | POA: Insufficient documentation
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
## 2022-09-01 NOTE — ED Notes (Signed)
Called x3 

## 2022-09-01 NOTE — ED Notes (Signed)
Called x2

## 2022-09-01 NOTE — ED Notes (Signed)
Called x1

## 2022-09-01 NOTE — Discharge Instructions (Addendum)
Take Tylenol and or ibuprofen for pain as needed. Apply ice to your knee and lip 20 minutes on 20 minutes off to help with swelling. Elevate knee.  Compression use with OTC Ace wraps can be helpful for stability and comfort. Get plenty of rest.

## 2023-02-23 ENCOUNTER — Other Ambulatory Visit: Payer: Self-pay

## 2023-02-23 ENCOUNTER — Emergency Department
Admission: EM | Admit: 2023-02-23 | Discharge: 2023-02-23 | Disposition: A | Discharge status: Home / Self Care | Payer: No Typology Code available for payment source | Attending: Emergency Medicine | Admitting: Emergency Medicine

## 2023-02-23 DIAGNOSIS — R197 Diarrhea, unspecified: Secondary | ICD-10-CM | POA: Diagnosis not present

## 2023-02-23 DIAGNOSIS — R112 Nausea with vomiting, unspecified: Secondary | ICD-10-CM | POA: Diagnosis present

## 2023-02-23 LAB — COMPREHENSIVE METABOLIC PANEL
ALT: 17 U/L (ref 0–44)
AST: 20 U/L (ref 15–41)
Albumin: 3.7 g/dL (ref 3.5–5.0)
Alkaline Phosphatase: 47 U/L (ref 38–126)
Anion gap: 8 (ref 5–15)
BUN: 17 mg/dL (ref 6–20)
CO2: 23 mmol/L (ref 22–32)
Calcium: 8.2 mg/dL — ABNORMAL LOW (ref 8.9–10.3)
Chloride: 105 mmol/L (ref 98–111)
Creatinine, Ser: 0.66 mg/dL (ref 0.44–1.00)
GFR, Estimated: >60 mL/min (ref >60)
Glucose, Bld: 89 mg/dL (ref 70–99)
Potassium: 3.6 mmol/L (ref 3.5–5.1)
Sodium: 136 mmol/L (ref 135–145)
Total Bilirubin: 0.2 mg/dL (ref <1.2)
Total Protein: 6.8 g/dL (ref 6.5–8.1)

## 2023-02-23 LAB — CBC
HCT: 39 % (ref 36.0–46.0)
Hemoglobin: 12.4 g/dL (ref 12.0–15.0)
MCH: 30.3 pg (ref 26.0–34.0)
MCHC: 31.8 g/dL (ref 30.0–36.0)
MCV: 95.4 fL (ref 80.0–100.0)
Platelets: 212 10*3/uL (ref 150–400)
RBC: 4.09 MIL/uL (ref 3.87–5.11)
RDW: 12.7 % (ref 11.5–15.5)
WBC: 5.3 10*3/uL (ref 4.0–10.5)
nRBC: 0 % (ref 0.0–0.2)

## 2023-02-23 LAB — URINALYSIS, ROUTINE W REFLEX MICROSCOPIC
Bilirubin Urine: NEGATIVE
Glucose, UA: NEGATIVE mg/dL
Hgb urine dipstick: NEGATIVE
Ketones, ur: NEGATIVE mg/dL
Leukocytes,Ua: NEGATIVE
Nitrite: NEGATIVE
Protein, ur: NEGATIVE mg/dL
Specific Gravity, Urine: 1.031 — ABNORMAL HIGH (ref 1.005–1.030)
pH: 5 (ref 5.0–8.0)

## 2023-02-23 LAB — LIPASE, BLOOD: Lipase: 24 U/L (ref 11–51)

## 2023-02-23 MED ORDER — LOPERAMIDE HCL 2 MG PO CAPS
2.0000 mg | ORAL_CAPSULE | Freq: Once | ORAL | Status: AC
  Administered 2023-02-23: 2 mg via ORAL
  Filled 2023-02-23: qty 1

## 2023-02-23 NOTE — Discharge Instructions (Addendum)
Please submit your stool sample to [facility] health laboratory.  Please return to the emergency room right away if you are to develop a fever, severe nausea, your pain becomes severe or worsens, you are unable to keep food down, begin vomiting any dark or bloody fluid, you develop any dark or bloody stools, feel dehydrated, or other new concerns or symptoms arise.

## 2023-02-23 NOTE — ED Provider Notes (Signed)
[facility] Provider Note    Event Date/Time   First MD Initiated Contact with Patient 02/23/23 0831     (approximate)   History   Nausea and loose stool  HPI  Tammy Allen is a 27 y.o. female reports no major past medical history   About 1 week ago started to have she describes a feeling of upset stomach, and intermittent nausea with loose stool.  She has vomited a few times.  She reports it comes and goes.  For instance she ate breakfast this morning, she is keeping fluid down.  She reports she is not having abdominal pain.  She reports is more like loose stool nausea feels like she maybe has a mild viral illness.  She did vomit once yesterday.  Otherwise able to eat drink and seems to be doing okay otherwise.  She advises that every 4-6 hours she is having a loose stool.  No black or bloody stool.    Denies pregnancy.  Just finished her menstrual cycle.  Denies any vaginal symptoms  Physical Exam   Triage Vital Signs: ED Triage Vitals  Encounter Vitals Group     BP 02/23/23 0830 123/75     Systolic BP Percentile --      Diastolic BP Percentile --      Pulse Rate 02/23/23 0830 73     Resp 02/23/23 0834 17     Temp 02/23/23 0834 99.6 F (37.6 C)     Temp Source 02/23/23 0834 Oral     SpO2 02/23/23 0830 99 %     Weight 02/23/23 0815 232 lb 5.8 oz (105.4 kg)     Height --      Head Circumference --      Peak Flow --      Pain Score --      Pain Loc --      Pain Education --      Exclude from Growth Chart --     Most recent vital signs: Vitals:   02/23/23 0834 02/23/23 0845  BP: 123/75   Pulse: (!) 59 63  Resp: 17   Temp: 99.6 F (37.6 C)   SpO2: 100% 100%     General: Awake, no distress.  His membranes are moist CV:  Good peripheral perfusion.  Normal tones and rate Resp:  Normal effort.  Clear bilaterally Abd:  No distention.  Abdomen soft nontender nondistended throughout except she reports a little bit of discomfort in the  suprapubic region which is mild.  She reports she would not of even noticed the tenderness except it is just a little sore when touched. Other:  Warm well-perfused.  Does not appear dehydrated.  Currently drinking water and eating crackers without difficulty   ED Results / Procedures / Treatments   Labs (all labs ordered are listed, but only abnormal results are displayed) Labs Reviewed  COMPREHENSIVE METABOLIC PANEL - Abnormal; Notable for the following components:      Result Value   Calcium 8.2 (*)    All other components within normal limits  URINALYSIS, ROUTINE W REFLEX MICROSCOPIC - Abnormal; Notable for the following components:   Color, Urine YELLOW (*)    APPearance HAZY (*)    Specific Gravity, Urine 1.031 (*)    All other components within normal limits  GASTROINTESTINAL PANEL BY PCR, STOOL (REPLACES STOOL CULTURE)  LIPASE, BLOOD  CBC  POC URINE PREG, ED   Labs are reassuring.  Her white blood cell count is  normal.  Comprehensive metabolic panel is normal with exception to mild hypocalcemia.  Urinalysis no evidence of infection  EKG     RADIOLOGY No noted indication for imaging.  She has no focal pain she reports some mild discomfort only to palpation in the suprapubic region.  Negative Murphy.  No pain McBurney's point.  No rebound or guarding.  There is no evidence by labs for elevated white count.  She is not febrile.  Clinically do not feel there is a high risk for an acute intra-abdominal emergency or high risk infection/appendicitis/cholecystitis/pancreatitis/diverticulitis type picture etc.    PROCEDURES:  Critical Care performed: No  Procedures   MEDICATIONS ORDERED IN ED: Medications  loperamide (IMODIUM) capsule 2 mg (2 mg Oral Given 02/23/23 1031)     IMPRESSION / MDM / ASSESSMENT AND PLAN / ED COURSE  I reviewed the triage vital signs and the nursing notes.                              Differential diagnosis includes, but is not limited to,  possible self-limited viral illness, food poisoning, allergy, malabsorption state, etc.  On clinical examination lab work extremely reassuring.  Doubt acute abdominal process, suspect most likely self-limited illness.  I did discuss with the patient careful return precautions and that if she were to start developing any severe persistent pain fevers persistent vomiting dehydration worsening symptoms she return to the ER.  Patient is agreeable with plan to continue her Zofran.  Would like to obtain stool studies here but patient not currently able to defecate.  Patient's presentation is most consistent with acute complicated illness / injury requiring diagnostic workup.  I reviewed urgent care note from yesterday, patient had urinalysis without evidence of infection.  Also has documented negative pregnancy test yesterday in care everywhere.  Of influenza testing  Mrs. Lame has a very reassuring abdominal examination.  She is resting quite comfortably at this time.  She does have loose stool and nausea, I suspect this is likely a form of enteritis of a likely self-limited nature.  I discussed with her careful return precautions, we discussed signs and symptoms that would indicate a need for return.  She is comfortable and agreeable with this plan.  Imodium utilized, she has prescription for Zofran.  Return precautions and treatment recommendations and follow-up discussed with the patient who is agreeable with the plan.         FINAL CLINICAL IMPRESSION(S) / ED DIAGNOSES   Final diagnoses:  Nausea vomiting and diarrhea     Rx / DC Orders   ED Discharge Orders          Ordered    Stool culture        02/23/23 1017    Clostridium difficile,EIA        02/23/23 1017    Ambulatory Referral to Primary Care (Establish Care)        02/23/23 1059             Note:  This document was prepared using Dragon voice recognition software and may include unintentional dictation errors.    Sharyn Creamer, MD 02/23/23 1655

## 2023-02-23 NOTE — ED Triage Notes (Signed)
C/O soft stools x 1 week and diarrhea x 1 day. Seen by [facility] for same on 11/10.  Symptoms initially nausea and vomiting.  Denies current N/V.  AAOx3.  Skin warm and dry. NAD
